# Patient Record
Sex: Male | Born: 1970 | Hispanic: Yes | Marital: Single | Smoking: Former smoker
Health system: Southern US, Community
[De-identification: ages and names within clinical notes are randomized; demographics above are authoritative.]

## PROBLEM LIST (undated history)

## (undated) DIAGNOSIS — E079 Disorder of thyroid, unspecified: Secondary | ICD-10-CM

## (undated) DIAGNOSIS — M549 Dorsalgia, unspecified: Secondary | ICD-10-CM

## (undated) DIAGNOSIS — I10 Essential (primary) hypertension: Secondary | ICD-10-CM

## (undated) DIAGNOSIS — G839 Paralytic syndrome, unspecified: Secondary | ICD-10-CM

## (undated) DIAGNOSIS — R220 Localized swelling, mass and lump, head: Secondary | ICD-10-CM

## (undated) DIAGNOSIS — G510 Bell's palsy: Secondary | ICD-10-CM

## (undated) DIAGNOSIS — K148 Other diseases of tongue: Secondary | ICD-10-CM

## (undated) HISTORY — PX: HAND SURGERY: SHX662

## (undated) HISTORY — PX: WRIST SURGERY: SHX841

---

## 2013-06-16 ENCOUNTER — Emergency Department
Admission: EM | Admit: 2013-06-16 | Discharge: 2013-06-16 | Disposition: A | Discharge status: Home / Self Care | Payer: Self-pay | Source: Home / Self Care | Attending: Emergency Medicine | Admitting: Emergency Medicine

## 2013-06-16 ENCOUNTER — Encounter: Payer: Self-pay | Admitting: Emergency Medicine

## 2013-06-16 DIAGNOSIS — K148 Other diseases of tongue: Secondary | ICD-10-CM

## 2013-06-16 HISTORY — DX: Paralytic syndrome, unspecified: G83.9

## 2013-06-16 NOTE — ED Provider Notes (Signed)
CSN: [id number]     Arrival date & time 06/16/13  0932 History   First MD Initiated Contact with Patient 06/16/13 [phone]     Chief Complaint  Patient presents with  . Mouth Lesions    HPI Patient c/o knot on right side of tongue x 10 years but recently has been having pain when eating and talking. He feels has gradually enlarged over the past 10 years. No bleeding or drainage. Denies fever chills, neck pain or swollen lymph nodes . No cardiorespiratory symptoms. No cough No other ENT symptoms. No nausea or vomiting or GI symptoms. Denies weight loss.  Past medical history: [place] 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 He has some chronic stable weakness left upper and lower extremity, but he is able to walk and function. He works in [employer].   Past Medical History  Diagnosis Date  . Paralysis    Past Surgical History  Procedure Laterality Date  . Hand surgery     Family History  Problem Relation Age of Onset  . Diabetes Mother    History  Substance Use Topics  . Smoking status: Former Games developermoker  . Smokeless tobacco: Never Used  . Alcohol Use: No    Review of Systems  All other systems reviewed and are negative.   Allergies  Review of patient's allergies indicates no known allergies.  Home Medications   Prior to Admission medications   Not on File   BP 127/80  Pulse 71  Temp(Src) 98.4 F (36.9 C) (Oral)  Resp 16  Ht 5\' 5"  (1.651 m)  Wt 195 lb 12 oz (88.792 kg)  BMI 32.57 kg/m2  SpO2 99% Physical Exam  Nursing note and vitals reviewed. Constitutional: He is oriented to person, place, and time. He appears well-developed and well-nourished. No distress.  HENT:  Head: Normocephalic and atraumatic.  Right Ear: Tympanic membrane and external ear normal.  Left Ear: Tympanic membrane and external ear normal.  Nose: Nose normal.  Mouth/Throat: No oropharyngeal exudate, posterior  oropharyngeal edema, posterior oropharyngeal erythema or tonsillar abscesses.    Right lateral posterior tongue, there is a fleshy pedunculated 1 x 1 cm raised lesion. It is mobile. No fluctuance or tenderness or bleeding or drainage . Oropharynx otherwise within normal limits without any other lesions  Left external ear: 3 x 3 mm stent dilated fleshy raised lesion that's nontender, without any bleeding or fluctuance or tenderness.  Eyes: Conjunctivae and EOM are normal. Pupils are equal, round, and reactive to light. No scleral icterus.  Neck: Normal range of motion. Neck supple.  No lymphadenopathy or mass  Cardiovascular: Normal rate and normal heart sounds.   Pulmonary/Chest: Effort normal and breath sounds normal.  Abdominal: He exhibits no distension.  Musculoskeletal: Normal range of motion.  Mild atrophy, 3+ strength left upper extremity and left lower extremity, but function otherwise normal .  4+ strength right upper and lower extremity  Neurological: He is alert and oriented to person, place, and time.  Skin: Skin is warm. No rash noted.  Psychiatric: He has a normal mood and affect.    ED Course  Procedures (including critical care time) Labs Review Labs Reviewed - No data to display  Imaging Review No results found.   MDM   1. Lesion of tongue    Most likely is benign, but explained to him that I cannot tell for certain and that he needs to see  an ENT for definitive care and surgically remove this.--I explained risks of has not seen an ENT  He has no PCP, and I urged him to establish with a PCP or clinic for preventive care, I explained risks of not doing so.  He speaks fair [place]. Also, ournurse Rhunette Croft(Mildred) assisted with Spanish interpretation so we could be certain that he understood instructions. He voiced understanding and agreement. (both in [place] and [place])   The following written instructions given from the AVS:  "You have a growth on the right side  of your tongue.--It is NOT contagious. By its appearance, it most likely is NOT cancer, however you need to see an ear nose throat specialist to have this surgically removed. We will help assist making referral to ear nose throat specialist.  Su crecimiento en el lado derecho de su lengua--NO es contaguioso. Por la aparencia, es seguro que no es cancer, pero es importante que vea a el especialista de ojido, naris y Administratorgaraganta para que le remuevan con [place].  Nosotros vamos a ayudarlo con esta cita y contactarlo."  Lajean Manesavid Massey, MD 06/16/13 1025

## 2013-06-16 NOTE — Discharge Instructions (Signed)
You have a growth on the right side of your tongue.--It is NOT contagious. By its appearance, it most likely is NOT cancer, however you need to see an ear nose throat specialist to have this surgically removed. We will help assist making referral to ear nose throat specialist.  Su crecimiento en el lado derecho de su lengua--NO es contaguioso. Por la aparencia, es seguro que no es cancer, pero es importante que vea a el especialista de ojido, naris y Administratorgaraganta para que le remuevan con [place].  Nosotros vamos a ayudarlo con esta cita y contactarlo.

## 2013-06-16 NOTE — ED Notes (Signed)
Patient c/o knot on right side of tongue x 10 years but recently has been having pain when eating and talking.

## 2013-06-18 ENCOUNTER — Telehealth: Payer: Self-pay | Admitting: *Deleted

## 2014-05-18 ENCOUNTER — Encounter: Payer: Self-pay | Admitting: Emergency Medicine

## 2014-05-18 ENCOUNTER — Emergency Department
Admission: EM | Admit: 2014-05-18 | Discharge: 2014-05-18 | Disposition: A | Discharge status: Home / Self Care | Payer: Self-pay | Source: Home / Self Care | Attending: Emergency Medicine | Admitting: Emergency Medicine

## 2014-05-18 DIAGNOSIS — B86 Scabies: Secondary | ICD-10-CM

## 2014-05-18 MED ORDER — HYDROXYZINE HCL 25 MG PO TABS: 25.0000 mg | ORAL_TABLET | Freq: Once | ORAL | Status: DC

## 2014-05-18 MED ORDER — PERMETHRIN 5 % EX CREA: TOPICAL_CREAM | CUTANEOUS | Status: DC

## 2014-05-18 NOTE — Discharge Instructions (Signed)
Escabiosis (Scabies) La escabiosis son pequeos parsitos (caros) que horadan la piel y causan protuberancias rojas y picazn. Estos parsitos slo pueden verse en el microscopio. Son muy contagiosos. Se diseminan fcilmente de una persona a otra por contacto directo. Tambin el contagio se produce al compartir prendas de vestir o ropa de cama. No es infrecuente que una familia entera se infecte al compartir toallas, prendas de vestir o ropa de cama.  INSTRUCCIONES PARA EL CUIDADO DOMICILIARIO  El profesional que lo asiste podr prescribirle alguna crema o locin para eliminar los caros. Si se le prescribe, masajee la crema en cada centmetro cuadrado de piel, desde el cuello hasta las plantas de los pies. Tambin aplique la crema en el cuero cabelludo y rostro si se trata de un nio de menos de 1 ao. Evite aplicarla en los ojos y en la boca. No se lave las manos despus de la aplicacin.  Djela durante 8 a 12 horas. El nio podr baarse o darse una ducha despus de 8 a 12 horas de la aplicacin. A veces es til aplicar la crema justo antes de la hora de dormir.  Generalmente un tratamiento es suficiente y eliminar aproximadamente el 95% de las infecciones. El los casos graves se indicar repetir el tratamiento luego de 1 semana. Todas las personas que habitan en la misma casa deben tratarse con una aplicacin de la crema.  No debern aparecer nuevas erupciones ni galeras luego de las 24 a 48 horas del tratamiento; sin embargo la picazn podra durar de 2 a 4 semanas despus del tratamiento. ste podr tambin prescribirle un medicamento para ayudarle con la picazn o hacer que desaparezca ms rpidamente.  Estos parsitos pueden vivir en la ropa hasta 3 das. Lave con agua caliente y seque a temperatura elevada durante 20 minutos todas las prendas, toallas, peluches y ropa de cama que el nio haya usado recientemente. Las prendas que no pueden lavarse, debern ser colocadas en una bolsa plstica  durante al menos 3 das.  Para aliviar la picazn, dele al nio en un bao de agua fra o aplique paos fros en las zonas afectadas.  El nio podr regresar a la escuela despus del tratamiento con la crema prescripta. SOLICITE ANTENCIN MDICA SI:  La picazn persiste durante ms de 4 semanas despus del tratamiento.  La erupcin se disemina o se infecta. Los signos de infeccin son ampollas rojas o costras de color marrn amarillento. Document Released: 11/11/2004 Document Revised: 04/26/2011 ExitCare Patient Information 2015 [employer]. This information is not intended to replace advice given to you by your health care provider. Make sure you discuss any questions you have with your health care provider.  

## 2014-05-18 NOTE — ED Notes (Signed)
Pt states his friend was dx with scabies yesterday and he has been in his home recently. He noticed some itching and rash on his ankels yesterday.

## 2014-05-18 NOTE — ED Provider Notes (Signed)
CSN: [id number]     Arrival date & time 05/18/14  1507 History   First MD Initiated Contact with Patient 05/18/14 1512     Chief Complaint  Patient presents with  . Rash   (Consider location/radiation/quality/duration/timing/severity/associated sxs/prior Treatment) HPI Pt states his friend was dx with scabies yesterday and he has been in his home recently. He noticed some itching and rash on his ankles and right groin yesterday. Otherwise feels well. No GU symptoms. Denies fever or chills or nausea or vomiting or cardiorespiratory symptoms. Denies any acute neurologic symptoms or swollen joints.  Remainder of Review of Systems negative for acute change except as noted in the HPI.  Past Medical History  Diagnosis Date  . Paralysis    Past Surgical History  Procedure Laterality Date  . Hand surgery     Family History  Problem Relation Age of Onset  . Diabetes Mother    History  Substance Use Topics  . Smoking status: Current Every Day Smoker    Types: Cigarettes  . Smokeless tobacco: Never Used  . Alcohol Use: No    Review of Systems  Allergies  Review of patient's allergies indicates no known allergies.  Home Medications   Prior to Admission medications   Medication Sig Start Date End Date Taking? Authorizing Provider  hydrOXYzine (ATARAX/VISTARIL) 25 MG tablet Take 1 tablet (25 mg total) by mouth once. Take 1 at bedtime as needed for itch. May cause drowsiness. 05/18/14   Lajean Manes, MD  permethrin (ELIMITE) 5 % cream Apply everywhere from the neck down, then wash off 8 hrs later. If needed, may repeat in 5 days if symptoms persist 05/18/14   Lajean Manes, MD   BP 124/75 mmHg  Pulse 76  Temp(Src) 97.9 F (36.6 C) (Oral)  SpO2 96% Physical Exam  Constitutional: He is oriented to person, place, and time. He appears well-developed and well-nourished. No distress.  HENT:  Head: Normocephalic and atraumatic.  Eyes: Conjunctivae and EOM are normal. Pupils are equal,  round, and reactive to light. No scleral icterus.  Neck: Normal range of motion.  Cardiovascular: Normal rate.   Pulmonary/Chest: Effort normal.  Abdominal: He exhibits no distension.  Musculoskeletal: Normal range of motion.  Neurological: He is alert and oriented to person, place, and time.  Skin: Skin is warm.  Psychiatric: He has a normal mood and affect.  Nursing note and vitals reviewed.  Skin: Tiny discrete red papules on both ankles and right proximal medial thigh, consistent with scabies. No pustules or vesicles. ED Course  Procedures (including critical care time) Labs Review Labs Reviewed - No data to display  Imaging Review No results found.   MDM   1. Scabies    Treatment options discussed, as well as risks, benefits, alternatives. Patient voiced understanding and agreement with the following plans: Discharge Medication List as of 05/18/2014  4:07 PM    START taking these medications   Details  hydrOXYzine (ATARAX/VISTARIL) 25 MG tablet Take 1 tablet (25 mg total) by mouth once. Take 1 at bedtime as needed for itch. May cause drowsiness., Starting 05/18/2014, Print    permethrin (ELIMITE) 5 % cream Apply everywhere from the neck down, then wash off 8 hrs later. If needed, may repeat in 5 days if symptoms persist, Print       other printed information given, both in [place] and Spanish printout Follow-up with your primary care doctor or dermatologist in 7 days if not improving, or sooner if symptoms become worse. Precautions  discussed. Red flags discussed. Questions invited and answered. Patient voiced understanding and agreement.      Lajean Manesavid Massey, MD 05/18/14 [phone]

## 2014-05-20 ENCOUNTER — Encounter: Payer: Self-pay | Admitting: Emergency Medicine

## 2014-08-09 DIAGNOSIS — S161XXA Strain of muscle, fascia and tendon at neck level, initial encounter: Secondary | ICD-10-CM | POA: Insufficient documentation

## 2014-08-09 DIAGNOSIS — M47812 Spondylosis without myelopathy or radiculopathy, cervical region: Secondary | ICD-10-CM | POA: Insufficient documentation

## 2014-10-11 ENCOUNTER — Emergency Department (INDEPENDENT_AMBULATORY_CARE_PROVIDER_SITE_OTHER)
Admission: EM | Admit: 2014-10-11 | Discharge: 2014-10-11 | Disposition: A | Discharge status: Home / Self Care | Payer: 59 | Source: Home / Self Care | Attending: Emergency Medicine | Admitting: Emergency Medicine

## 2014-10-11 ENCOUNTER — Encounter: Payer: Self-pay | Admitting: *Deleted

## 2014-10-11 DIAGNOSIS — M79604 Pain in right leg: Secondary | ICD-10-CM

## 2014-10-11 DIAGNOSIS — M25511 Pain in right shoulder: Secondary | ICD-10-CM | POA: Diagnosis not present

## 2014-10-11 MED ORDER — MELOXICAM 15 MG PO TABS: 15.0000 mg | ORAL_TABLET | Freq: Every day | ORAL | Status: DC
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
## 2014-10-11 NOTE — ED Notes (Signed)
appt sch'ed with Dr.Corey for 10/14/14 @ 10AM per [place]. Clemens Catholic, LPN
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
## 2014-10-14 ENCOUNTER — Ambulatory Visit (INDEPENDENT_AMBULATORY_CARE_PROVIDER_SITE_OTHER): Payer: 59 | Admitting: Family Medicine

## 2014-10-14 ENCOUNTER — Encounter: Payer: Self-pay | Admitting: Family Medicine

## 2014-10-14 VITALS — BP 134/83 | HR 76 | Wt 202.0 lb

## 2014-10-14 DIAGNOSIS — M5441 Lumbago with sciatica, right side: Secondary | ICD-10-CM

## 2014-10-14 DIAGNOSIS — M546 Pain in thoracic spine: Secondary | ICD-10-CM | POA: Diagnosis not present

## 2014-10-14 DIAGNOSIS — M545 Low back pain, unspecified: Secondary | ICD-10-CM | POA: Insufficient documentation

## 2014-10-14 MED ORDER — CYCLOBENZAPRINE HCL 10 MG PO TABS: 10.0000 mg | ORAL_TABLET | Freq: Three times a day (TID) | ORAL | Status: DC | PRN
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
## 2014-10-14 NOTE — Assessment & Plan Note (Signed)
Patient has hyperalgesia in the thoracic area.  Unclear etiology. X-rays were normal. Work on physical therapy. Return if not better. Treat with Flexeril.

## 2014-10-14 NOTE — Patient Instructions (Signed)
Thank you for coming in today. Attend physical therapy.  Return in 2-4 weeks.  Come back or go to the emergency room if you notice new weakness new numbness problems walking or bowel or bladder problems. We will get records sent over from [facility].   Citica con rehabilitacin (Sciatica with Rehab) El nervio citico va desde la regin inferior de la espalda hacia la pierna y es el responsable de la sensibilidad y el control de los msculos de la parte de atrs (posterior) del muslo, pierna y pie. La citica es una enfermedad caracterizada por una inflamacin en este nervio.  SNTOMAS  Signos de dao al nervio, incluso adormecimiento o debilidad en el lado posterior de las extremidades bajas.  Dolor en la parte posterior del muslo que podra bajar hacia la pierna.  Dolor que [employer] al estar sentado durante largos perodos de [place].  Algunas veces, sensibilidad en las nalgas. CAUSAS La causa de la citica es la inflamacin de los nervios citicos. La inflamacin se debe a que algo irrita el nervio. Entre las causas de la irritacin se encuentran:  Estar sentado durante largos perodos.  Traumatismos directos al nervio.  Artritis de Landscape architect.  Hernia o ruptura de disco.  Deslizamiento de Blenda Bridegroom (espondilolistesis).  Presin de los tejidos blandos, como msculos o el tejidos tipo ligamento (fascia). LOS RIESGOS AUMENTAN CON:  Deportes en los que se presiona la columna (ftbol americano o levantamiento de pesas).  Poca fuerza y flexibilidad.  No hacer un precalentamiento adecuado.  Historia familiar de dolor de cintura o trastornos en discos.  Lesiones o cirugas previas en la espalda.  Mecnica incorrecta del cuerpo, en especial al levantar, o mala postura. PREVENCIN  Precalentamiento adecuado y elongacin antes de la [place].  Mantener la forma fsica:  Earma [place], flexibilidad y resistencia muscular.  Capacidad cardiovascular.  Conozca y use tcnicas  adecuadas, especialmente en posturas y levantamiento. Cuando sea posible, tener un entrenador que corrija la Administrator.  Evite las actividades que tensionen constantemente la columna. PRONSTICO Si se trata adecuadamente, generalmente es curable dentro de las 6 semanas. En ocasiones requiere someterse a [place].  COMPLICACIONES RELACIONADAS  Dao permanente que incluye dolor, adormecimiento, hormigueo o debilidad.  Dolor crnico en la espalda.  Riesgos de la ciruga: infecciones, hemorragias, dao en los nervios, o daos a los tejidos circundantes. TRATAMIENTO El tratamiento inicial incluye interrumpir las actividades que agravan los sntomas. Se incluye el uso de medicamentos y la aplicacin de hielo para reducir Chief Technology Officer y la inflamacin. Los ejercicios de elongacin y fortalecimiento pueden ayudar a reducir Chief Technology Officer con la [place]. Los ejercicios pueden Management consultant o con un terapeuta. Un terapeuta podr recomendarle otros tratamientos, como estimulacin nerviosa electrnica transcutnea (TENS) o ultrasonido. En algunos casos se indica una inyeccin de corticoides para reducir la inflamacin del nervio citico. Si los sntomas persisten por ms de 6 meses de tratamiento no quirrgico (conservador), se Public relations account executive. MEDICAMENTOS   Si necesita analgsicos, se recomiendan los antiinflamatorios no esteroides, como aspirina e ibuprofeno y otros calmantes menores, como acetaminofeno.  No tome medicamentos para el dolor dentro de los [address] previos a la [place].  Los analgsicos prescriptos se indicarn si el mdico lo considera necesario. Utilcelos como se le indique y slo cuando lo necesite.  Los ungentos pueden ser beneficiosos.  En algunos casos se indica una inyeccin de corticosteroides. Estas inyecciones deben reservarse para los casos graves, porque slo se pueden administrar una determinada cantidad de  veces. CALOR Y FRO   El tratamiento con fro  [employer] y reduce la inflamacin. El fro debe aplicarse durante 10 a 15 minutos cada 2  3 horas para reducir la inflamacin y Chief Technology Officer e inmediatamente despus de cualquier actividad que agrava los sntomas. Utilice bolsas de hielo o masajee la zona con un trozo de hielo (masaje de hielo).  El calor puede usarse antes de Therapist, music y de las actividades de fortalecimiento indicadas por el profesional, le fisioterapeuta o Orthoptist. Utilice una bolsa trmica o sumerja la lesin en agua caliente. SOLICITE ATENCIN MDICA SI:  El tratamiento parece no ofrecer beneficios, o el trastorno [employer].  Los medicamentos producen efectos secundarios. EJERCICIOS EJERCICIOS DE AMPLITUD DE MOVIMIENTOS Y ELONGACIN Citica La mayora de las personas con dolor de citica encuentran que sus sntomas empeoran con el delantero excesiva flexin (flexin) o arco en la espalda baja (extensin). Los ejercicios que le ayudarn a Oncologist sus sntomas se Research scientist (life sciences). El mdico, fisioterapeuta o Furniture conservator/restorer a Chief Strategy Officer qu ejercicios sern de ayuda para resolver su dolor de espalda. No realice ningn ejercicio sin consultarlo antes con el profesional. Discontinue los ejercicios que empeoran sus sntomas, hasta que hable con el mdico. Si siente dolor, entumecimiento u hormigueo que [employer] glteos, piernas o pies, el objetivo de esta terapia es que estos sntomas se acerquen a la espalda y Customer service manager. En ocasiones, los sntomas de la pierna mejorarn, Development worker, international aid en la espalda puede empeorar; esto es un indicio tpico del progreso en la rehabilitacin. Asegrese de que estar atento a cualquier cambio en sus sntomas y las actividades que ha [employer]	[place] 24 horas antes del cambio. Compartir esta informacin con su mdico le permitir un mejor tratamiento para tratar su enfermedad. Estos ejercicios le ayudarn en la recuperacin de la lesin. Los sntomas podrn  aliviarse con o sin asistencia adicional de su mdico, fisioterapeuta o Herbalist. Al completar estos ejercicios, recuerde:   Restaurar la flexibilidad del tejido ayuda a que las articulaciones recuperen el movimiento normal. Esto permite que el movimiento y la actividad sea ms saludables y menos dolorosos.  Para que sea efectiva, cada elongacin debe realizarse durante al menos 30 segundos.  La elongacin nunca debe ser dolorosa. Deber sentir slo un alargamiento o distensin suave del tejido que estira. EJERCICIOS DE AMPLITUD DE MOVIMIENTOS Y ELONGACIN: ELONGACION Flexin - una rodilla al pecho  Recustese en una cama dura o sobre el piso, con ambas piernas extendidas al frente.  Manteniendo una pierna en contacto con el piso, lleve la rodilla opuesta al pecho. Mantenga la pierna en esa posicin, sostenindola por la zona posterior del muslo o por la rodilla.  Presione hasta sentir un suave estiramiento en la cintura. Mantenga esta posicin durante __________ segundos.  Libere la pierna lentamente y repita el ejercicio con el lado opuesto. Reptalo __________ veces. Realice este estiramiento __________ Anthoney Harada por da.  ELONGACIN Flexin, dos rodillas al pecho   Recustese en una cama dura o sobre el piso, con ambas piernas extendidas al frente.  Manteniendo una pierna en contacto con el piso, lleve la rodilla opuesta al pecho.  Tense los msculos del estmago para apoyar la espalda y levante la otra rodilla [place]. Mantenga las piernas en su lugar y tmese por detrs de las caderas o las rodillas.  Con ambas rodillas en el pecho, tire hasta que sienta un estiramiento en la parte trasera de la espalda. Mantenga esta  posicin durante __________ segundos.  Tense los msculos del estmago y baje las piernas de a una por vez. Reptalo __________ veces. Realice este estiramiento __________ Anthoney Harada por da.  ELONGACIN Rotacin de la zona baja del tronco.  Recustese sobre una  cama firme o sobre el suelo. Mantenga las piernas al frente, doble las rodillas de modo que ambas apunten hacia el techo y los pies queden bien apoyados en el piso.  Extienda los brazos a Dance movement psychotherapist. Esto estabilizar la zona superior del cuerpo, manteniendo los hombros en contacto con el piso.  Con cuidado y lentamente deje caer ambas rodillas juntas hacia un lado, hasta que sienta un suave estiramiento en la espalda baja. Mantenga esta posicin durante __________ segundos.  Tense los msculos del estmago para apoyar la espalda y lleve las rodillas a la posicin inicial. Repita el ejercicio hacia el otro lado. Reptalo __________ veces. Realice este ejercicio __________ veces por da. EJERCICIOS DE AMPLITUD DE MOVIMIENTOS Y FLEXIBILIDAD: ELONGACIN Extensin posicin prona sobre los codos  Acustese sobre el estmago sobre el piso, una cama ser muy blanda. Coloque las palmas a una distancia igual al ancho de los hombres y a la altura de la cabeza.  Coloque los codos bajo los hombros. Si siente dolor, colquese almohadas debajo del pecho.  Deje que su cuerpo se relaje, de modo que las caderas queden ms abajo y tengan ms contacto con el piso.  Mantenga esta posicin durante __________ segundos.  Vuelva lentamente a la posicin plana sobre el piso. Reptalo __________ veces. Realice este estiramiento __________ Anthoney Harada por da.  AMPLITUD DE MOVIMIENTOS - Extensin - flexin de brazos en posicin prona  Acustese sobre el [employer]	[place] piso, una cama ser [place]. Coloque las palmas a una distancia igual al ancho de los hombres y a la altura de la cabeza.  Mantenga la espalda tan relajada como pueda, enderece lentamente los codos [address] las caderas contra el suelo. Puede modificar la posicin de las manos para estar ms cmodo. A medida que gana movimiento, sus manos quedarn ms por debajo de los hombros.  Mantenga cada posicin durante __________ segundos.  Vuelva  lentamente a la posicin plana sobre el piso. Reptalo __________ veces. Realice este estiramiento __________ Anthoney Harada por da.  EJERCICIOS DE FORTALECIMIENTO - Citica Estos ejercicios le ayudarn en la recuperacin de la lesin. Estos ejercicios deben hacerse cerca de su "punto dulce". Este es el arco neutro, de la parte baja de la espalda, en algn lugar entre la posicin completamente redondeada y arqueada plenamente, que es la posicin menos dolorosa. Cuando se realiza en [employer] de seguridad del movimiento, estos ejercicios se pueden Chemical engineer para las personas que tienen una lesin basada en flexin o extensin. Con estos ejercicios, los sntomas podrn desaparecer con o sin mayor intervencin del profesional, el fisioterapeuta o Orthoptist. Al completar estos ejercicios, recuerde:   Los msculos pueden ganar tanto la resistencia como la fuerza que necesita para sus actividades diarias a travs de ejercicios controlados.  Realice los ejercicios como se lo indic el mdico, el fisioterapeuta o Orthoptist. [employer] slo con los ejercicios de resistencia y haga las repeticiones que su mdico le indique.  Podr experimentar dolor o cansancio muscular, pero el dolor o molestia que trata de eliminar a travs de los ejercicios nunca debe empeorar. Si el dolor empeora, detngase y asegrese de que est siguiendo las directivas correctamente. Si an siente dolor luego de Education officer, environmental lo ajustes necesarios, deber discontinuar el ejercicio hasta que  pueda conversar con [employer]. FORTALECIMIENTO - Abdominales profundos - Inclinacin plvica  Recustese sobre una cama firme o sobre el suelo. Mantenga las piernas al frente, doble las rodillas de modo que ambas apunten hacia el techo y los pies queden bien apoyados en el piso.  Tensione la zona baja de los msculos abdominales para presionar la Oncologist. Este movimiento har rotar su pelvis de modo que el cccix quede  hacia arriba y no apuntando a los pies o hacia el piso.  Con una tensin suave y respiracin pareja, mantenga esta posicin durante __________ segundos. Reptalo __________ veces. Realice este estiramiento __________ Anthoney Harada por da.  FORTALECIMIENTO - Abdominales encogimiento abdominal  Recustese sobre una cama firme o sobre el suelo. Mantenga las piernas al frente, doble las rodillas de modo que ambas apunten hacia el techo y los pies queden bien apoyados en el piso. Cruce las [employer].  Apunte suavemente con la barbilla hacia abajo, sin doblar el cuello.  Tensione los abdominales y eleve lentamente el tronco la altura suficiente para despegar los omplatos. Si se eleva ms, pondr tensin excesiva en la cintura y esto no fortalecer ms los abdominales.  Controle la vuelta a la posicin inicial. Reptalo __________ veces. Realice este estiramiento __________ Anthoney Harada por da.  FORTALECIMIENTO - En cuadrpedo, elevacin de miembro superior e inferior opuestos  [employer] y las rodillas en una superficie firme. Las manos deben quedar a la altura de los hombros y las rodillas debajo de las caderas. Puede colocar algo debajo las rodillas para estar ms cmodo.  Encuentre la posicin neutral de la columna vertebral y Public house manager los msculos abdominales de modo que pueda mantener esta posicin. Los hombros y las caderas deben formar un rectngulo paralelo con el suelo y recto.  Manteniendo el tronco firme, eleve la mano derecha a la altura del hombro y luego eleve la pierna izquierda a la altura de la cadera. Asegrese de no contener la respiracin. Mantenga cada posicin durante __________ segundos.  Con los msculos abdominales en tensin y la espalda firme, vuelva lentamente a la posicin inicial. Repita con el otro brazo y la otra pierna. Reptalo __________ veces. Realice este estiramiento __________ Anthoney Harada por da.  FUERZA Abdominales y cudriceps - Lexicographer las  piernas rectas  Recustese en una cama dura o sobre el piso, con ambas piernas extendidas al frente.  Deje una pierna en contacto con el suelo y doble la otra rodilla de manera que el pie quede contra el suelo.  Encuentre la posicin neutral de la columna vertebral y Public house manager los msculos abdominales de modo que pueda mantener esta posicin.  Levante lentamente la pierna del suelo una 6 pulgadas y cuente [place] 15, asegrese de no contener la respiracin.  Mantega la columna en posicin neutral, y baje lentamente la pierna hasta el suelo. Repita el ejercicio con cada pierna __________ veces. Realice este estiramiento __________ Anthoney Harada por da. CONSIDERACIONES ACERCA DE LA POSTURA Y LA MECNICA DEL CUERPO Citica Si mantiene una postura correcta cuando se encuentre de pie, sentado o realizando sus actividades, reducir el [employer] tejidos del cuerpo, y Acupuncturist a los tejidos lesionados la posibilidad de curarse y Film/video editor las experiencias dolorosas. A continuacin se indican pautas generales para mejorar la postura- Su mdico o fisioterapeuta le dar instrucciones especficas segn sus necesidades. Al leer estas pautas recuerde:  Los ejercicios indicados por su mdico lo ayudarn a Chartered certified accountant flexibilidad y Primary school teacher  para Therapist, nutritional.  Una postura correcta le proporciona a sus articulaciones el medio ptimo para funcionar bien. Las articulaciones se desgastan menos cuando estn sostenidas adecuadamente por una columna vertebral en buena postura. Esto significa que su cuerpo estar ms sano y Research officer, trade union.  La correcta postura debe practicarse en todas las actividades, especialmente al estar sentado o de pie durante [place]. Tambin es importante al realizar actividades repetitivas de bajo estrs (tipeo) o una actividad nica y pesada. POSICIONES DE Dellie Catholic Tenga en cuenta cules son las posturas que ms dolor le causan al elegir una posicin de  descanso. Si siente dolor con las actividades en que deba realizar una flexin (sentarse, inclinarse, detenerse, ponerse en cuclillas), elija una posicin que le permita descansar en una postura menos flexionada. Evite curvarse en posicin fetal cuando se encuentre de lado. Si el dolor empeora con las actividades basadas en la extensin (estar de pie durante un tiempo prolongado, trabajar con las manos por arriba de la cabeza) evite descansar en Neomia Dear posicin extendida durante mucho tiempo, como dormir sobre el [place]. La [employer] de las Psychologist, forensic cmodo el descanso sobre la columna vertebral en una posicin neutral, ni muy redondeada ni [place]. Recustese sobre su lado en una cama que no est hundida con una almohada entre las rodillas o sobre la espalda con una almohada bajo las rodillas, y sentir [place]. Tenga en cuenta que cualquier posicin en [employer], no importa si es una postura [place], puede provocarle rigidez. POSTURAS CORRECTAS PARA SENTARSE Con el fin de minimizar el estrs y Environmental health practitioner en su columna, deber sentarse con la postura correcta. Esto le ayudar a que el cuerpo est ms sano. Recuperar una buena postura es un proceso gradual. Muchas personas pueden trabajar ms cmodas mediante el uso de diferentes soportes hasta que tengan la flexibilidad y la fuerza para mantener esta postura por su cuenta. Al sentarse con la [employer], los odos deben estar sobre los hombros y los hombros sobre las caderas. Debe utilizar el respaldo de la silla para apoyar la espalda. La espalda estar en una posicin neutral, ligeramente arqueada. Puede colocar una pequea almohada o toalla doblada en la base de la espalda baja para apoyarse.  Si trabaja en un escritorio, cree un ambiente que le proporciones un buen soporte y [place]. Sin soporte extra, los msculos se fatigan y causan tensin excesiva en las articulaciones y otros tejidos. Tenga en cuenta estas  recomendaciones: SILLA:   La silla debe poder deslizarse por debajo del escritorio cuando su espalda tome contacto con el respaldo. Esto le permitir trabajar ms cerca.  La altura de la silla debe permitirle que los ojos tengan el nivel de la parte superior del monitor y las manos estn ms abajo que los codos. POSICIN DEL CUERPO  Los pies deben tener contacto con el piso. Si no es posible, use un posapies.  Mantenga las [employer] hombros. Esto reducir el estrs en el cuello y en la cintura. POSTURAS INCORRECTAS PARA SENTARSE  Si se siente cansado e incapaz de asumir una postura sentada sana, no se encorve ni se hunda. Esto pone una tensin excesiva en los tejidos de su espalda, y causa ms dao y Engineer, mining.Entre las opciones ms saludables se incluyen:  El uso de ms apoyo, como una almohada lumbar.  Cambio de tareas, a algo que demande una posicin vertical o caminar.  Tomar una breve caminata.  Recostarse y Lawyer en una posicin neutral. DE  PIE DURANTE UN TIEMPO PROLONGADO E INCLINADO LIGERAMENTE HACIA ADELANTE Cuando deba realizar una tarea que requiera inclinacin hacia adelante estando de pie en el mismo sitio durante mucho tiempo, coloque un pie en un objeto de 2 a 4 pulgadas de alto, para [employer]. Cuando ambos pies estn en el piso, la zona inferior de la espalda tiene a perder su ligera curvatura hacia adentro. Si esta curva se aplana (o se pronuncia demasiado) la espalda y las articulaciones experimentarn demasiado estrs, se fatigarn ms rpidamente y Geophysicist/field seismologist.  POSTURAS CORRECTAS PARA ESTAR DE PIE Una postura adecuada de pie realizarse en todas las actividades diarias, incluso si slo toman un momento, como al [employer]. Como en la postura de sentado, los odos deben estar sobre los hombros y los hombros sobre las caderas. Deber mantener una ligera tensin en sus msculos abdominales para asegurar la columna vertebral. El  cccix debe apuntar hacia el suelo, no detrs de su cuerpo, por que resulta en una curvatura de la espalda sobre-extendida.  POSTURAS INCORRECTAS PARA ESTAR DE PIE Las posturas incorrectas para estar de pie incluyen tener la cabeza hacia delante, las rodillas bloqueadas o una excesiva curvatura de la espalda. CAMINAR [place] en Deborha Payment erguida. Las [place], hombros y caderas deben estar alineados. ACTIVIDAD PROLONGADA EN UNA POSICIN FLEXIONADA Al completar una tarea que requiere que se doble la cintura hacia adelante o inclinarse sobre una superficie baja, trate de encontrar una manera de estabilizar 3 de cada 4 de sus miembros. Puede colocar una mano o el codo en el [employer], o descansar una rodilla en la superficie en la que est apoyado. Esto le proporcionar ms estabilidad para que sus msculos no se cansen tan rpidamente. El [employer] rodillas [place], o ligeramente dobladas, tambin reducir el estrs en la espalda baja. TCNICAS CORRECTAS PARA LEVANTAR OBJETOS SI:   Asumir una postura amplia. Esto le proporcionar ms estabilidad y la oportunidad de acercarse lo ms posible al objeto que se est levantando.  Tense los abdominales para asegurar la columna vertebral; luego flexione rodillas y caderas. Manteniendo la espalda en una posicin neutral, haga el esfuerzo con los msculos de la pierna. Levntese con las piernas, manteniendo la espalda derecha.  Pruebe el peso de los objetos desconocidos antes de tratar de Secondary school teacher.  Trate de [employer] codos hacia abajo y a los lados, con el fin de obtener la fuerza de los hombros al llevar un objeto.  Siempre pida ayuda a otra persona cuando deba levantar objetos pesados o incmodos TCNICAS INCORRECTAS PARA LEVANTAR OBJETOS NO:   Bloquee rodillas al levantar, aunque sea un objeto pequeo.  Se doble ni gire. Gire sobre los pies ni los mueva cuando necesite cambiar de direccin.  Tome conciencia que no puede levantar con  seguridad ni un clip de papel, sin Clinical biochemist. Document Released: 01/20/2009 Document Revised: 04/26/2011 [facility] Patient Information 2015 Rule, [place]. This information is not intended to replace advice given to you by your health care provider. Make sure you discuss any questions you have with your health care provider.

## 2014-10-14 NOTE — Assessment & Plan Note (Signed)
Patient's pain today is largely related to his low back pain with right leg radicular component. This is different from the pain he was evaluated for [place] with his neck. X-rays were normal therefore I do not feel that we need to repeat them today. We'll have a trial of physical therapy dedicated to his lumbar pain. If no better in a few weeks will obtain an MRI of the lumbar spine.

## 2014-10-18 ENCOUNTER — Ambulatory Visit: Payer: Self-pay | Admitting: Physical Therapy

## 2014-10-23 ENCOUNTER — Encounter: Payer: Self-pay | Admitting: Rehabilitative and Restorative Service Providers"

## 2014-10-23 ENCOUNTER — Ambulatory Visit (INDEPENDENT_AMBULATORY_CARE_PROVIDER_SITE_OTHER): Payer: Self-pay | Admitting: Rehabilitative and Restorative Service Providers"

## 2014-10-23 DIAGNOSIS — M2560 Stiffness of unspecified joint, not elsewhere classified: Secondary | ICD-10-CM

## 2014-10-23 DIAGNOSIS — M546 Pain in thoracic spine: Secondary | ICD-10-CM

## 2014-10-23 DIAGNOSIS — M545 Low back pain, unspecified: Secondary | ICD-10-CM

## 2014-10-23 DIAGNOSIS — M623 Immobility syndrome (paraplegic): Secondary | ICD-10-CM

## 2014-10-23 DIAGNOSIS — Z7409 Other reduced mobility: Secondary | ICD-10-CM
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
## 2014-10-25 ENCOUNTER — Encounter: Payer: 59 | Admitting: Rehabilitative and Restorative Service Providers"

## 2014-11-04 ENCOUNTER — Ambulatory Visit (INDEPENDENT_AMBULATORY_CARE_PROVIDER_SITE_OTHER): Payer: Self-pay | Admitting: Family Medicine

## 2014-11-04 ENCOUNTER — Encounter: Payer: Self-pay | Admitting: Family Medicine

## 2014-11-04 VITALS — BP 131/74 | HR 84 | Wt 205.0 lb

## 2014-11-04 DIAGNOSIS — M546 Pain in thoracic spine: Secondary | ICD-10-CM

## 2014-11-04 NOTE — Assessment & Plan Note (Signed)
Trial of return to work next week. If not able to return to work. We'll consider a functional capacity evaluation.

## 2014-11-04 NOTE — Patient Instructions (Signed)
Thank you for coming in today. Return in 3-4 weeks.  Return to work

## 2014-11-04 NOTE — Progress Notes (Signed)
Marc Gonzalez is a 44 y.o. male who presents to [facility]: [facility]  today for follow-up back pain.  Patient was seen on August 29 for back pain. He's had one episode of physical therapy notes that it helped a lot. He rates his pain currently in his thoracic back in about a 5 out of 10. He notes his low back pain has significantly improved. When asked he thinks he is ready to try to go back to work next week. He notes mild pain when he exerts himself around the house. He denies any radiating pain weakness or numbness fevers or chills.   Past Medical History  Diagnosis Date  . Paralysis    Past Surgical History  Procedure Laterality Date  . Hand surgery     Social History  Substance Use Topics  . Smoking status: Current Every Day Smoker -- 0.50 packs/day    Types: Cigarettes  . Smokeless tobacco: Never Used  . Alcohol Use: No   family history includes Diabetes in his mother; Hypertension in his mother.  ROS as above Medications: Current Outpatient Prescriptions  Medication Sig Dispense Refill  . cyclobenzaprine (FLEXERIL) 10 MG tablet Take 1 tablet (10 mg total) by mouth 3 (three) times daily as needed for muscle spasms. 90 tablet 1  . meloxicam (MOBIC) 15 MG tablet Take 1 tablet (15 mg total) by mouth daily. As needed for pain. Take with food. 7 tablet 0   No current facility-administered medications for this visit.   No Known Allergies   Exam:  BP 131/74 mmHg  Pulse 84  Wt 205 lb (92.987 kg)  SpO2 97% Gen: Well NAD HEENT: EOMI,  MMM Lungs: Normal work of breathing. CTABL Heart: RRR no MRG Abd: NABS, Soft. Nondistended, Nontender Exts: Brisk capillary refill, warm and well perfused.  Back: Mildly tender palpation bilateral rhomboids. Normal back range of motion. Normal gait. Normal upper and lower extremity strength.  No results found for this or any previous visit (from the past 24 hour(s)). No results found.   Please see individual  assessment and plan sections.

## 2014-11-14 ENCOUNTER — Encounter: Payer: Self-pay | Admitting: Family Medicine

## 2014-11-14 ENCOUNTER — Ambulatory Visit (INDEPENDENT_AMBULATORY_CARE_PROVIDER_SITE_OTHER): Payer: Self-pay | Admitting: Family Medicine

## 2014-11-14 VITALS — BP 151/97 | HR 85 | Wt 202.0 lb

## 2014-11-14 DIAGNOSIS — I8393 Asymptomatic varicose veins of bilateral lower extremities: Secondary | ICD-10-CM

## 2014-11-14 DIAGNOSIS — M545 Low back pain, unspecified: Secondary | ICD-10-CM

## 2014-11-14 DIAGNOSIS — M546 Pain in thoracic spine: Secondary | ICD-10-CM

## 2014-11-14 DIAGNOSIS — I8390 Asymptomatic varicose veins of unspecified lower extremity: Secondary | ICD-10-CM
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
## 2014-11-14 NOTE — Assessment & Plan Note (Signed)
Etiology at this time is unclear. Patient has had symptoms for about 6 months and is unable to work. This time for definitive diagnosis. Obtain MRI of lumbar back. This will likely be largely normal and will refer for functional capacity evaluation. Return following MRI. Continue physical therapy.
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
## 2014-11-14 NOTE — Patient Instructions (Signed)
Thank you for coming in today. Go back to physical therapy.  Get the MRI next week.  Return a few days after the MRI to discuss results.

## 2014-11-14 NOTE — Assessment & Plan Note (Signed)
Etiology at this time is unclear. Patient has had symptoms for about 6 months and is unable to work. This time for definitive diagnosis. Obtain MRI of thoracic back. This will likely be largely normal and will refer for functional capacity evaluation. Return following MRI. Continue physical therapy.

## 2014-11-18 ENCOUNTER — Ambulatory Visit: Payer: Self-pay | Admitting: Rehabilitative and Restorative Service Providers"

## 2014-11-19 ENCOUNTER — Ambulatory Visit (INDEPENDENT_AMBULATORY_CARE_PROVIDER_SITE_OTHER): Payer: Self-pay | Admitting: Physical Therapy

## 2014-11-19 ENCOUNTER — Encounter: Payer: Self-pay | Admitting: Physical Therapy

## 2014-11-19 DIAGNOSIS — M545 Low back pain, unspecified: Secondary | ICD-10-CM

## 2014-11-19 DIAGNOSIS — M546 Pain in thoracic spine: Secondary | ICD-10-CM

## 2014-11-19 DIAGNOSIS — M623 Immobility syndrome (paraplegic): Secondary | ICD-10-CM

## 2014-11-19 DIAGNOSIS — Z7409 Other reduced mobility: Secondary | ICD-10-CM

## 2014-11-19 DIAGNOSIS — M2560 Stiffness of unspecified joint, not elsewhere classified: Secondary | ICD-10-CM

## 2014-11-19 NOTE — Patient Instructions (Signed)
Hand out given for LTR, standing scap retraction and FWD lean into back stretch holding onto counter

## 2014-11-19 NOTE — Therapy (Signed)
[facility] [place] [address] [place], [place], [address] Phone: [phone]   Fax:  [phone]  Physical Therapy Treatment  Patient Details  Name: Marc Gonzalez MRN: [id number] Date of Birth: 03/01/1970 Referring Provider:  Rodolph Bong, MD  Encounter Date: 11/19/2014      PT End of Session - 11/19/14 0939    Visit Number 2   Number of Visits 12   PT Start Time 0939   PT Stop Time 1037   PT Time Calculation (min) 58 min   Activity Tolerance Patient limited by pain      Past Medical History  Diagnosis Date  . Paralysis [facility]     Past Surgical History  Procedure Laterality Date  . Hand surgery      There were no vitals filed for this visit.  Visit Diagnosis:  Pain in thoracic spine at multiple sites  Low back pain at multiple sites  Stiffness due to immobility  Decreased functional mobility and endurance      Subjective Assessment - 11/19/14 0943    Subjective Pt reports that since his last visit things have been basically the same. He is having an MRI on Saturday.    Patient is accompained by: Interpreter   Pertinent History Problems sleeping also, tolerates about ~ 1 hr.    How long can you sit comfortably? 10-20 min   How long can you stand comfortably? 5-10 min   How long can you walk comfortably? 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    Currently in Pain? Yes   Pain Score 7    Pain Location Thoracic   Pain Type Acute pain   Pain Radiating Towards down the Rt LE with walking some times.    Pain Onset More than a month ago   Pain Frequency Intermittent   Aggravating Factors  walking, moving , sleeping   Pain Relieving Factors meds and ice   Multiple Pain Sites Yes   Pain Score 4   Pain Location Back   Pain Orientation Mid;Lower   Pain Descriptors / Indicators Aching   Pain Onset  More than a month ago   Aggravating Factors  same as above   Pain Relieving Factors same as above            [facility] PT Assessment - 11/19/14 0001    Assessment   Medical Diagnosis thoracic/low back pain    Onset Date/Surgical Date 05/25/14   Hand Dominance Right   Next MD Visit 12/01/14   Prior Therapy for neck ~ 2 month ago PT with no change - 7 visits    Observation/Other Assessments   Focus on Therapeutic Outcomes (FOTO)  71% limited   AROM   Overall AROM Comments thoracic flexion decreased 25%, ext 75% bilat rotation50%    Strength   Overall Strength Comments knees/ankles WNL, Lt UE grossly 5/5, Rt shoulder 5-/5   Palpation   Palpation comment very tight in lower thoracic paraspinals with tenderness, tender in lumbar                      [facility] Adult PT Treatment/Exercise - 11/19/14 0001    Exercises   Exercises Lumbar   Lumbar Exercises: Stretches   Lower Trunk Rotation --  10 reps   Lumbar Exercises: Standing   Other Standing Lumbar Exercises standing scap retraction  Other Standing Lumbar Exercises standing forward lean holding on to counter for back stretch - required many verbal cues    Modalities   Modalities Ultrasound   Ultrasound   Ultrasound Location Rt lower thoracic paraspinals   Ultrasound Parameters combo, 100%, 3.66mhz, 1.5 w/cm2   Ultrasound Goals Pain  tone   Manual Therapy   Manual Therapy Soft tissue mobilization;Myofascial release;Joint mobilization   Manual therapy comments pt is hypersenstive to touch all over his back.    Joint Mobilization grade II mobs as tolerated   Soft tissue mobilization to mid/low thoracic with deep pressure   Myofascial Release to back paraspinals                PT Education - 11/19/14 1234    Education provided Yes   Education Details HEP and discussion on importance of attending scheduled PT sessions along with the importance of gentle motion to decrease muscular spasms   Person(s) Educated  Patient;Other (comment)  interpretrer   Methods Explanation;Demonstration;Tactile cues;Verbal cues;Handout   Comprehension Returned demonstration;Verbalized understanding             PT Long Term Goals - 11/19/14 1238    PT LONG TERM GOAL #1   Title Patient I in HEP for discharge 12/04/14   Status On-going   PT LONG TERM GOAL #2   Title Patient to demonstrate 75% of normal trunk ROM 12/04/14   Status On-going   PT LONG TERM GOAL #3   Title Patient to tolerate sitting for 30 minutes 12/04/14   Status On-going   PT LONG TERM GOAL #4   Title Patient to tolerate standing/walking for 30-45 min 12/04/14   Status On-going   PT LONG TERM GOAL #5   Title Decrease FOTO to </= 52% limitatiion 12/04/14   Status On-going               Plan - 11/19/14 1236    Clinical Impression Statement Pt returns to therapy after missing multiple scheduled appointments.  Informed patient that he wasn't going to improve if he didn't attend sessions and perform his HEP.  He verbalized understanding through the interpreter. He has very tight musculatur with a lot of guarding and protective posturing.  He needs gentle movement to break this up along with manual work and modalities.    Pt will benefit from skilled therapeutic intervention in order to improve on the following deficits Pain;Decreased range of motion;Decreased mobility;Decreased endurance;Decreased activity tolerance   Rehab Potential Good   PT Frequency 2x / week   PT Duration 6 weeks   PT Treatment/Interventions Patient/family education;ADLs/Self Care Home Management;Therapeutic exercise;Therapeutic activities;Cryotherapy;Electrical Stimulation;Iontophoresis /ml Dexamethasone;Moist Heat;Ultrasound;Dry needling   PT Next Visit Plan STW, modalities and gentle stretching.    Consulted and Agree with Plan of Care Patient        Problem List Patient Active Problem List   Diagnosis Date Noted  . 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  Thoracic back pain 10/14/2014  . Lumbago 10/14/2014    Roderic Scarce PT 11/19/2014, 12:39 PM  [facility] [address] [place], [place], [address] Phone: [phone]   Fax:  [phone]

## 2014-11-22 ENCOUNTER — Ambulatory Visit (INDEPENDENT_AMBULATORY_CARE_PROVIDER_SITE_OTHER): Payer: Self-pay | Admitting: Physical Therapy

## 2014-11-22 DIAGNOSIS — M545 Low back pain, unspecified: Secondary | ICD-10-CM

## 2014-11-22 DIAGNOSIS — M546 Pain in thoracic spine: Secondary | ICD-10-CM

## 2014-11-22 DIAGNOSIS — M2560 Stiffness of unspecified joint, not elsewhere classified: Secondary | ICD-10-CM

## 2014-11-22 DIAGNOSIS — M623 Immobility syndrome (paraplegic): Secondary | ICD-10-CM

## 2014-11-22 DIAGNOSIS — Z7409 Other reduced mobility: Secondary | ICD-10-CM

## 2014-11-22 NOTE — Therapy (Addendum)
[place] [place] [facility], [place], [address] Phone: [phone]   Fax:  [phone]  Physical Therapy Treatment  Patient Details  Name: Marc Gonzalez MRN: [id number] Date of Birth: 1970-10-13 Referring Provider:  Gregor Hams, MD  Encounter Date: 11/22/2014      PT End of Session - 11/22/14 1149    Visit Number 3   Number of Visits 12   Date for PT Re-Evaluation 12/04/14   PT Start Time 9798   PT Stop Time 1250   PT Time Calculation (min) 65 min   Activity Tolerance Patient limited by pain      Past Medical History  Diagnosis Date  . Paralysis [facility])     Past Surgical History  Procedure Laterality Date  . Hand surgery      There were no vitals filed for this visit.  Visit Diagnosis:  Pain in thoracic spine at multiple sites  Low back pain at multiple sites  Stiffness due to immobility  Decreased functional mobility and endurance      Subjective Assessment - 11/22/14 1149    Subjective Pt reports slight improvement since last visit, exercises at home are going well. "Pain in middle of my back comes and goes" "My hurt (Rt) foot is making my back hurt"    Patient is accompained by: Interpreter   Currently in Pain? Yes   Pain Score 5    Pain Location Back   Pain Orientation Lower;Mid   Pain Descriptors / Indicators Sharp   Aggravating Factors  walking.    Pain Relieving Factors medication, ice    Effect of Pain on Daily Activities unable to work             [facility] PT Assessment - 11/22/14 0001    Assessment   Medical Diagnosis thoracic/low back pain    Onset Date/Surgical Date 05/25/14   Hand Dominance Right   Next MD Visit 12/01/14            [facility] Adult PT Treatment/Exercise - 11/22/14 0001    Lumbar Exercises: Stretches   Single Knee to Chest Stretch 3 reps   Single Knee to Chest Stretch Limitations Pt winced, reported increased discomfort.    Lower Trunk Rotation --  15  sec hold, 4 reps each side    Prone on Elbows Stretch 1 rep;20 seconds   Prone on Elbows Stretch Limitations Pt reported increased pain in this position.    Lumbar Exercises: Aerobic   Stationary Bike NuStep L4: 6 min (arms and LLE- Rt foot resting off of machine)   Lumbar Exercises: Standing   Other Standing Lumbar Exercises standing scap retraction against door edge x 5 sec hold x 10 reps    Other Standing Lumbar Exercises standing forward lean holding on to counter for back stretch - required many verbal cues (modified childs pose/lat stretch with hands on back of truck) x 15 sec x 5 reps   Lumbar Exercises: Supine   Ab Set 5 seconds;10 reps   Clam 5 reps;1 second  each side   Bent Knee Raise 10 reps  with ab set   Lumbar Exercises: Sidelying   Hip Abduction 10 reps   Hip Abduction Weights (lbs) only able to complete 6 reps on LLE due to increased LBP.     Modalities   Modalities Moist Heat;Electrical Stimulation   Moist Heat Therapy   Number Minutes Moist Heat 15 Minutes   Moist Heat Location Lumbar Spine  thoracic  Theme park manager Action IFC   Electrical Stimulation Parameters to tolerance    Electrical Stimulation Goals Pain          PT Education - 11/22/14 1254    Education provided Yes   Education Details HEP- add trans abd serier (english version due to lack of illustrations in Epic spanish version).  Self care- issued information on TENS unit.    Person(s) Educated Patient;Other (comment)  interpreter.    Methods Explanation;Demonstration   Comprehension Verbalized understanding;Returned demonstration             PT Long Term Goals - 11/19/14 1238    PT LONG TERM GOAL #1   Title Patient I in HEP for discharge 12/04/14   Status On-going   PT LONG TERM GOAL #2   Title Patient to demonstrate 75% of normal trunk ROM 12/04/14   Status On-going   PT LONG TERM GOAL #3   Title Patient to  tolerate sitting for 30 minutes 12/04/14   Status On-going   PT LONG TERM GOAL #4   Title Patient to tolerate standing/walking for 30-45 min 12/04/14   Status On-going   PT LONG TERM GOAL #5   Title Decrease FOTO to </= 52% limitatiion 12/04/14   Status On-going               Plan - 11/22/14 1239    Clinical Impression Statement Pt reported discomfort with both lumbar flex and extension.  Pt tolerated supine transverse abdominis exercises with frequent cues, without increase in pain.  Pain in Rt foot is an additional barrier to progress. Pt reported decreased pain in back after session.    Pt will benefit from skilled therapeutic intervention in order to improve on the following deficits Pain;Decreased range of motion;Decreased mobility;Decreased endurance;Decreased activity tolerance   Rehab Potential Good   PT Frequency 2x / week   PT Duration 6 weeks   PT Treatment/Interventions Patient/family education;ADLs/Self Care Home Management;Therapeutic exercise;Therapeutic activities;Cryotherapy;Electrical Stimulation;Iontophoresis 15m/ml Dexamethasone;Moist Heat;Ultrasound;Dry needling   PT Next Visit Plan continue core strengthening, gentle stretching, posture / body mechanics education.    PT Home Exercise Plan to begin increasing activity level; work on improved body mechanics; check on TENS unit for pain management   Consulted and Agree with Plan of Care Patient        Problem List Patient Active Problem List   Diagnosis Date Noted  . 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 Thoracic back pain 10/14/2014  . Lumbago 10/14/2014    JKerin Perna PTA 11/22/2014 12:57 PM  [address] [address]Phone: [phone]  Fax:  [phone]    PHYSICAL THERAPY DISCHARGE SUMMARY  Visits from Start of Care: 3  Current functional level related to goals / functional  outcomes: Continued pain and limitations   Remaining deficits; No significant changes   Education / Equipment: HEP; education re movement/dysfunction  Plan: Patient agrees to discharge.  Patient goals were not met. Patient is being discharged due to not returning since the last visit.  ?????   Celyn P. HHelene KelpPT, MPH 12/11/2014 12:07 PM

## 2014-11-23 ENCOUNTER — Ambulatory Visit (HOSPITAL_BASED_OUTPATIENT_CLINIC_OR_DEPARTMENT_OTHER): Payer: Self-pay

## 2014-11-23 ENCOUNTER — Ambulatory Visit (HOSPITAL_BASED_OUTPATIENT_CLINIC_OR_DEPARTMENT_OTHER)
Admission: RE | Admit: 2014-11-23 | Discharge: 2014-11-23 | Disposition: A | Discharge status: Home / Self Care | Payer: Self-pay | Source: Ambulatory Visit | Attending: Family Medicine | Admitting: Family Medicine

## 2014-11-23 DIAGNOSIS — M545 Low back pain, unspecified: Secondary | ICD-10-CM

## 2014-11-23 DIAGNOSIS — M546 Pain in thoracic spine: Secondary | ICD-10-CM | POA: Insufficient documentation

## 2014-11-23 DIAGNOSIS — E042 Nontoxic multinodular goiter: Secondary | ICD-10-CM | POA: Insufficient documentation

## 2014-11-23 DIAGNOSIS — M79604 Pain in right leg: Secondary | ICD-10-CM | POA: Insufficient documentation

## 2014-11-25 ENCOUNTER — Encounter: Payer: Self-pay | Admitting: Family Medicine

## 2014-11-25 ENCOUNTER — Ambulatory Visit (INDEPENDENT_AMBULATORY_CARE_PROVIDER_SITE_OTHER): Payer: Self-pay | Admitting: Family Medicine

## 2014-11-25 ENCOUNTER — Encounter: Payer: Self-pay | Admitting: Rehabilitative and Restorative Service Providers"

## 2014-11-25 VITALS — BP 145/78 | HR 89 | Wt 207.0 lb

## 2014-11-25 DIAGNOSIS — E041 Nontoxic single thyroid nodule: Secondary | ICD-10-CM

## 2014-11-25 DIAGNOSIS — G8929 Other chronic pain: Secondary | ICD-10-CM

## 2014-11-25 DIAGNOSIS — I8393 Asymptomatic varicose veins of bilateral lower extremities: Secondary | ICD-10-CM

## 2014-11-25 DIAGNOSIS — M546 Pain in thoracic spine: Secondary | ICD-10-CM

## 2014-11-25 DIAGNOSIS — M5441 Lumbago with sciatica, right side: Secondary | ICD-10-CM

## 2014-11-25 DIAGNOSIS — I8390 Asymptomatic varicose veins of unspecified lower extremity: Secondary | ICD-10-CM

## 2014-11-25 NOTE — Assessment & Plan Note (Signed)
Patient declined workup of this issue at this time. Will follow-up. I stressed the importance of workup at this incidentally discovered finding in the near future

## 2014-11-25 NOTE — Assessment & Plan Note (Signed)
Vascular ultrasound pending

## 2014-11-25 NOTE — Assessment & Plan Note (Signed)
At this point chronic. Refer for PT functional capacity evaluation

## 2014-11-25 NOTE — Patient Instructions (Signed)
Thank you for coming in today. 1) I recommend that you have the thyroid nodules fully evaluated with an ultrasound and blood work.  2) You should hear from physical therapy soon for a functional capacity evaluation 3) You should also hear from vascular imaging to evaluate the possible varicose veins of the right leg.  Return in 2-3 weeks.   Venas varicosas (Varicose Veins) Las venas varicosas son venas que se han agrandado y tornado sinuosas. Suelen aparecer [employer] piernas, pero tambin pueden verse en otra parte del cuerpo. CAUSAS Esta afeccin se presenta como consecuencia del mal funcionamiento de las vlvulas de las venas, las cuales ayudan al retorno de la sangre desde las piernas hacia el corazn. Si estas vlvulas se daan, la sangre retrocede y regresa a las venas de la pierna, cerca de la superficie de la piel, lo que causa dilatacin venosa. FACTORES DE RIESGO Las personas que estn mucho tiempo paradas, las embarazadas o las personas con sobrepeso tienen ms probabilidades de tener venas varicosas. SIGNOS Y SNTOMAS  Venas abultadas, azuladas y de aspecto sinuoso que se observan con mayor frecuencia en las piernas.  Dolor o sensacin de Development worker, community las piernas. Estos sntomas pueden empeorar al final del da.  Hinchazn de las piernas.  Cambios en el color de la piel. DIAGNSTICO Generalmente, el mdico puede diagnosticar las venas varicosas al examinarle las piernas. Adems, puede recomendarle que se haga una ecografa de las venas de las piernas. TRATAMIENTO La mayora de las venas varicosas pueden tratarse en casa. Sin embargo, hay otros tratamientos a disposicin de [employer] tienen sntomas persistentes o desean mejorar la apariencia esttica de las venas varicosas. Estas opciones de tratamiento incluyen lo siguiente:  Escleroterapia. Se inyecta una solucin en la vena para anularla.  Tratamiento con lser. Se [place] un rayo lser para calentar la vena y  anularla.  Ablacin venosa por radiofrecuencia Se [place] una corriente elctrica que se produce mediante ondas de radio para anular la vena.  Flebectoma. Se extirpa quirrgicamente la vena a travs de pequeas incisiones que se hacen sobre la vena varicosa.  Ligadura venosa y varicectoma. La vena se extirpa quirrgicamente a travs de incisiones que se realizan sobre la vena varicosa despus de haberla anudado (ligado). INSTRUCCIONES PARA EL CUIDADO EN EL HOGAR  No permanezca sentado o de pie en una posicin durante mucho tiempo. No se siente con las piernas cruzadas. Descanse con las piernas Radiation protection practitioner.  Use medias de compresin como le haya indicado su mdico. Estas medias ayudan a evitar la formacin de cogulos sanguneos y a Building services engineer de las piernas.  No use otras prendas que le ajusten todo el contorno de las piernas, la pelvis o la cintura.  Camine todo lo posible para aumentar la circulacin de Risk manager.  A la noche, eleve el pie de la cama con bloques de 2pulgadas.  Si tiene un corte en la piel sobre la vena y la vena sangra, recustese con la pierna elevada y [place] presin en el lugar con un pao limpio, hasta que deje de Geophysicist/field seismologist. Luego aplique un apsito (vendaje) sobre el corte. Consulte al mdico si el sangrado contina. SOLICITE ATENCIN MDICA SI:  La piel alrededor del tobillo empieza a Lobbyist.  Siente dolor, hay enrojecimiento, sensibilidad o hinchazn dura en la pierna sobre una vena.  Est incmodo debido al dolor de la pierna.   Esta informacin no tiene Theme park manager el consejo del mdico. Asegrese de hacerle al mdico  cualquier pregunta que tenga.   Document Released: 11/11/2004 Document Revised: 02/22/2014 Elsevier Interactive Patient Education [employer]
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
## 2014-11-25 NOTE — Assessment & Plan Note (Signed)
At this point chronic. Refer for PT functional capacity evaluation  

## 2014-11-26 ENCOUNTER — Encounter: Payer: Self-pay | Admitting: Physical Therapy

## 2014-11-27 ENCOUNTER — Other Ambulatory Visit: Payer: Self-pay | Admitting: Family Medicine

## 2014-11-27 DIAGNOSIS — M79604 Pain in right leg: Secondary | ICD-10-CM

## 2014-11-27 DIAGNOSIS — I8390 Asymptomatic varicose veins of unspecified lower extremity: Secondary | ICD-10-CM

## 2014-12-03 ENCOUNTER — Inpatient Hospital Stay (HOSPITAL_COMMUNITY): Admission: RE | Admit: 2014-12-03 | Payer: Self-pay | Source: Ambulatory Visit

## 2014-12-03 DIAGNOSIS — R0989 Other specified symptoms and signs involving the circulatory and respiratory systems: Secondary | ICD-10-CM

## 2014-12-19 ENCOUNTER — Ambulatory Visit (INDEPENDENT_AMBULATORY_CARE_PROVIDER_SITE_OTHER): Payer: Self-pay | Admitting: Family Medicine

## 2014-12-19 ENCOUNTER — Encounter: Payer: Self-pay | Admitting: Family Medicine

## 2014-12-19 VITALS — BP 146/73 | HR 75 | Wt 205.0 lb

## 2014-12-19 DIAGNOSIS — I8393 Asymptomatic varicose veins of bilateral lower extremities: Secondary | ICD-10-CM

## 2014-12-19 DIAGNOSIS — M545 Low back pain, unspecified: Secondary | ICD-10-CM

## 2014-12-19 DIAGNOSIS — E041 Nontoxic single thyroid nodule: Secondary | ICD-10-CM

## 2014-12-19 DIAGNOSIS — K148 Other diseases of tongue: Secondary | ICD-10-CM | POA: Insufficient documentation

## 2014-12-19 DIAGNOSIS — R220 Localized swelling, mass and lump, head: Secondary | ICD-10-CM

## 2014-12-19 DIAGNOSIS — I8390 Asymptomatic varicose veins of unspecified lower extremity: Secondary | ICD-10-CM

## 2014-12-19 DIAGNOSIS — G8929 Other chronic pain: Secondary | ICD-10-CM

## 2014-12-19 DIAGNOSIS — I781 Nevus, non-neoplastic: Secondary | ICD-10-CM | POA: Insufficient documentation

## 2014-12-19 LAB — CBC
HEMATOCRIT: 45.4 % (ref 39.0–52.0)
HEMOGLOBIN: 15.4 g/dL (ref 13.0–17.0)
MCH: 28.6 pg (ref 26.0–34.0)
MCHC: 33.9 g/dL (ref 30.0–36.0)
MCV: 84.2 fL (ref 78.0–100.0)
MPV: 10.6 fL (ref 8.6–12.4)
PLATELETS: 220 10*3/uL (ref 150–400)
RBC: 5.39 MIL/uL (ref 4.22–5.81)
RDW: 13.8 % (ref 11.5–15.5)
WBC: 5.2 10*3/uL (ref 4.0–10.5)

## 2014-12-19 LAB — COMPLETE METABOLIC PANEL WITH GFR
ALBUMIN: 4.1 g/dL (ref 3.6–5.1)
ALT: 46 U/L (ref 9–46)
AST: 35 U/L (ref 10–40)
Alkaline Phosphatase: 86 U/L (ref 40–115)
BILIRUBIN TOTAL: 1 mg/dL (ref 0.2–1.2)
BUN: 14 mg/dL (ref 7–25)
CO2: 27 mmol/L (ref 20–31)
CREATININE: 0.9 mg/dL (ref 0.60–1.35)
Calcium: 9.4 mg/dL (ref 8.6–10.3)
Chloride: 106 mmol/L (ref 98–110)
GFR, Est Non African American: >89 mL/min (ref >=60)
Glucose, Bld: 117 mg/dL — ABNORMAL HIGH (ref 65–99)
Potassium: 4.2 mmol/L (ref 3.5–5.3)
Sodium: 140 mmol/L (ref 135–146)
TOTAL PROTEIN: 6.7 g/dL (ref 6.1–8.1)

## 2014-12-19 LAB — T3: T3, Total: 145.1 ng/dL (ref 80.0–204.0)

## 2014-12-19 LAB — TSH: TSH: 0.506 u[IU]/mL (ref 0.350–4.500)

## 2014-12-19 LAB — T4, FREE: FREE T4: 1.11 ng/dL (ref 0.80–1.80)
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
## 2014-12-19 NOTE — Assessment & Plan Note (Signed)
Likely benign. Patient declined referral to ENT

## 2014-12-19 NOTE — Assessment & Plan Note (Signed)
Improved. Continue to follow

## 2014-12-19 NOTE — Assessment & Plan Note (Signed)
Benign-appearing. No superficial spreading. we'll continue to follow

## 2014-12-19 NOTE — Patient Instructions (Signed)
Thank you for coming in today. Return following tests to discuss results.  We will call for the appointments for both ultrasounds.   Ndulos tiroideos (Thyroid Nodule) Un ndulo tiroideo es un crecimiento aislado de clulas tiroideas que forman un bulto en la glndula tiroidea. La glndula tiroidea tiene forma de [place] y est [place]ubicada en la parte delantera inferior del cuello. Esta glndula enva mensajeros qumicos (hormonas) a travs de la sangre a todo el organismo. Estas hormonas son importantes en la regulacin de la temperatura corporal y ayudan al cuerpo a Software engineerusar la energa. Los ndulos tiroideos son frecuentes. Katha HammingLa mayora no son cancerosos (son benignos). Una persona puede tener uno o varios ndulos.  Hay diferentes tipos de ndulos tiroideos, entre ellos:  Ndulos que crecen y se llenan de lquido (quistes tiroideos).  Ndulos que producen una cantidad excesiva de hormona tiroidea (ndulos calientes o hipertiroideos).  Ndulos que no producen hormona tiroidea (ndulos fros o hipotiroideos).  Ndulos que se forman a partir de Scientist, clinical (histocompatibility and immunogenetics)clulas cancerosas (cncer de tiroides). CAUSAS Por lo general, se desconoce la causa de esta afeccin. FACTORES DE RIESGO Los factores que aumentan la probabilidad de que esta afeccin se desarrolle incluyen lo siguiente:  La edad. Los ndulos tiroideos son ms frecuentes en las Smith Internationalpersonas mayores de [place]45aos.  El sexo.  Los ndulos tiroideos benignos son ms frecuentes en las mujeres,  y los cancerosos (malignos), en los hombres.  Antecedentes familiares que abarcan lo siguiente:  Ndulos tiroideos.  Feocromocitoma.  Carcinoma de tiroides.  Hiperparatiroidismo.  Determinados tipos de enfermedades tiroideas, como tiroiditis de [place] RidgeHashimoto.  Carencia de yodo.  Antecedentes de radiacin en la cabeza y el cuello, por ejemplo, rayosX. SNTOMAS Es frecuente que esta afeccin no cause sntomas. Si tiene sntomas, estos pueden Johnson & Johnsonincluir los  siguientes:  Un bulto en la parte inferior del cuello.  Sensacin de tener un bulto o un cosquilleo en la garganta.  Dolor en el cuello, la mandbula o el odo.  Dificultad para tragar. Los ndulos calientes pueden causar algunos de los siguientes sntomas:  Prdida de Hernandopeso.  Piel enrojecida y caliente.  Sensacin de calor.  Nerviosismo.  Frecuencia cardaca acelerada. Los ndulos fros pueden causar algunos de los siguientes sntomas:  Aumento de Clintpeso.  Piel seca.  Debilitamiento del cabello que tambin puede presentarse con la cada del cabello.  Sensacin de fro.  Fatiga. Los ndulos tiroideos cancerosos pueden causar BorgWarneralgunos de los siguientes sntomas:  Ndulos duros que parecen estar adheridos a la glndula tiroidea.  Ronquera.  Bultos en los ganglios cercanos a la tiroides (ganglios linfticos). DIAGNSTICO El mdico puede palpar un ndulo tiroideo durante un examen fsico. Esta afeccin tambin se puede diagnosticar en funcin de los sntomas. Tambin pueden hacerle exmenes que incluyen los siguientes:  Hydrologistcografa. Se puede realizar para Optometristconfirmar el diagnstico.  Biopsia. Se toma una muestra del ndulo y se la examina con un microscopio para determinar si el ndulo es benigno.  Anlisis de sangre para saber si la tiroides funciona correctamente.  Se pueden realizar estudios de diagnstico por imgenes, como una resonancia magntica (RM) o una tomografa computarizada (TC) en los siguientes casos:  El ndulo tiroideo es grande.  El ndulo tiroideo est obstruyendo las vas respiratorias.  Se sospecha la presencia de cncer. TRATAMIENTO El tratamiento depende de la causa y del tamao del o de los ndulos. Si el ndulo es benigno, tal vez no se necesite tratamiento. El mdico puede controlar el ndulo para saber si desaparece sin tratamiento. Si el ndulo sigue creciendo,  es canceroso o no desaparece:  Tal vez haya que drenarlo con una aguja.  Es  posible que haya que extirparlo con [place]. Si se somete a [place], puede que tambin haya que extirpar una parte o la totalidad de la glndula tiroides. INSTRUCCIONES PARA EL CUIDADO EN EL HOGAR  Est atento a cualquier cambio en el ndulo.  Tome los medicamentos de venta libre y los recetados solamente como se lo haya indicado el mdico.  Oceanographer a todas las visitas de control como se lo haya indicado el mdico. Esto es importante. SOLICITE ATENCIN MDICA SI:  Le cambia la voz.  Presenta dificultad para tragar.  Siente dolor en el cuello, el odo o la mandbula que se vuelve ms intenso.  El ndulo se agranda.  El ndulo empieza a dificultarle la respiracin. SOLICITE ATENCIN MDICA DE INMEDIATO SI:  Tiene fiebre que aparece de repente.  Se siente muy dbil.  Parece que los msculos se le estn encogiendo (prdida de la masa muscular).  Tiene cambios en el estado de nimo.  Est muy inquieto.  Se siente confundido.  Ve u oye cosas que otras personas no ven ni oyen (tiene alucinaciones).  Tiene nuseas que aparecen de repente o vomita.  Tiene diarrea sbitamente.  Siente dolor en el pecho.  Pierde la conciencia.   Esta informacin no tiene Theme park manager el consejo del mdico. Asegrese de hacerle al mdico cualquier pregunta que tenga.   Document Released: 01/19/2012 Document Revised: 10/23/2014 Elsevier Interactive Patient Education [employer]

## 2014-12-19 NOTE — Assessment & Plan Note (Signed)
Seated with workup including thyroid labs metabolic panel CBC and a ultrasound of the thyroid. Follow-up after ultrasound performed

## 2014-12-20 NOTE — Progress Notes (Signed)
Quick Note:  Thyroid labs are completely normal. This is a good thing. We are waiting on the ultrasound. ______

## 2014-12-25 ENCOUNTER — Inpatient Hospital Stay (HOSPITAL_COMMUNITY): Admission: RE | Admit: 2014-12-25 | Payer: Self-pay | Source: Ambulatory Visit

## 2014-12-27 ENCOUNTER — Other Ambulatory Visit: Payer: Self-pay

## 2016-01-19 DIAGNOSIS — R079 Chest pain, unspecified: Secondary | ICD-10-CM | POA: Insufficient documentation

## 2016-01-22 ENCOUNTER — Encounter: Payer: Self-pay | Admitting: *Deleted

## 2016-01-22 ENCOUNTER — Emergency Department (INDEPENDENT_AMBULATORY_CARE_PROVIDER_SITE_OTHER)
Admission: EM | Admit: 2016-01-22 | Discharge: 2016-01-22 | Disposition: A | Discharge status: Home / Self Care | Payer: Self-pay | Source: Home / Self Care | Attending: Family Medicine | Admitting: Family Medicine

## 2016-01-22 ENCOUNTER — Other Ambulatory Visit: Payer: Self-pay

## 2016-01-22 DIAGNOSIS — R200 Anesthesia of skin: Secondary | ICD-10-CM

## 2016-01-22 DIAGNOSIS — R202 Paresthesia of skin: Secondary | ICD-10-CM

## 2016-01-22 DIAGNOSIS — H02402 Unspecified ptosis of left eyelid: Secondary | ICD-10-CM

## 2016-01-22 DIAGNOSIS — R079 Chest pain, unspecified: Secondary | ICD-10-CM

## 2016-01-22 HISTORY — DX: Essential (primary) hypertension: I10

## 2016-01-22 MED ORDER — ASPIRIN 81 MG PO CHEW
162.0000 mg | CHEWABLE_TABLET | Freq: Once | ORAL | Status: AC
  Administered 2016-01-22: 162 mg via ORAL

## 2016-01-22 NOTE — ED Triage Notes (Signed)
[facility] with CP and htn. Reports he still had chest pain at discharge but awoke with worsening CP. Took prescribed amlodipine and 81 mg ASA this AM. C/o some SOB and 3 weeks of left facial numbness and left finger tingling. EKG done, provider notified.

## 2016-01-22 NOTE — ED Provider Notes (Signed)
CSN: [id number]     Arrival date & time 01/22/16  1149 History   First MD Initiated Contact with Patient 01/22/16 1200     Chief Complaint  Patient presents with  . Chest Pain  . Facial Droop   (Consider location/radiation/quality/duration/timing/severity/associated sxs/prior Treatment) HPI Marc Gonzalez is a 45 y.o. male presenting to UC with c/o Left sided sharp constant chest pain that started at [facility]5AM when he woke up.  He also reports Left sided facial numbness and mild pain.  Per Care Everywhere, pt was admitted on 01/19/16 and discharged 01/20/16 for chest pain r/o. He had a normal stress test and was discharged home with amlodipine and advised to take 81mg  aspirin daily.  The aspirin did help minimally this morning but pain is still 8/10.  Nothing makes it better or worse. Pt initially stated chest pain and facial symptoms have been going on for several weeks but then stated it was new and worse this morning.  Denies SOB.  Pt concerned about Left side of his face and head.  He notes he did have a stroke about 13 years ago but is unsure if he had residual Left sided numbness or weakness.  He has not f/u with PCP since recent hospital admission at [facility].    Past Medical History:  Diagnosis Date  . Hypertension   . Paralysis [facility](HCC)    Past Surgical History:  Procedure Laterality Date  . HAND SURGERY     Family History  Problem Relation Age of Onset  . Diabetes Mother   . Hypertension Mother    Social History  Substance Use Topics  . Smoking status: Current Every Day Smoker    Packs/day: 0.50    Types: Cigarettes  . Smokeless tobacco: Never Used  . Alcohol use No    Review of Systems  Constitutional: Negative for chills and fever.  HENT: Negative for congestion, ear pain, sore throat, trouble swallowing and voice change.   Respiratory: Positive for chest tightness. Negative for cough and shortness of breath.   Cardiovascular: Positive for chest pain. Negative  for palpitations.  Gastrointestinal: Negative for abdominal pain, diarrhea, nausea and vomiting.  Musculoskeletal: Negative for arthralgias, back pain and myalgias.  Skin: Negative for rash.  Neurological: Positive for facial asymmetry, numbness ( Left side face and hand) and headaches ( Left side). Negative for dizziness, tremors, seizures, syncope, speech difficulty, weakness and light-headedness.    Allergies  Patient has no known allergies.  Home Medications   Prior to Admission medications   Medication Sig Start Date End Date Taking? Authorizing Provider  amLODipine (NORVASC) 5 MG tablet Take 5 mg by mouth daily.   Yes Historical Provider, MD  aspirin 81 MG chewable tablet Chew by mouth daily.   Yes Historical Provider, MD   Meds Ordered and Administered this Visit   Medications  aspirin chewable tablet 162 mg (162 mg Oral Given 01/22/16 1208)    BP 134/85 (BP Location: Left Arm)   Pulse 84   SpO2 97%  No data found.   Physical Exam  Constitutional: He is oriented to person, place, and time. He appears well-developed and well-nourished. No distress.  Pt lying on exam bed, appears mildly anxious. Cooperative during exam.  HENT:  Head: Normocephalic and atraumatic.  Right Ear: Tympanic membrane normal.  Left Ear: Tympanic membrane normal.  Nose: Nose normal.  Mouth/Throat: Uvula is midline, oropharynx is clear and moist and mucous membranes are normal.  Eyes: EOM are normal.  Neck: Normal  range of motion. Neck supple.  Cardiovascular: Normal rate and regular rhythm.   Pulses:      Radial pulses are 2+ on the right side, and 2+ on the left side.  Pulmonary/Chest: Effort normal and breath sounds normal. No respiratory distress. He has no wheezes. He has no rales. He exhibits no tenderness.  Abdominal: Soft. He exhibits no distension. There is no tenderness.  Musculoskeletal: Normal range of motion.  Neurological: He is alert and oriented to person, place, and time. A  cranial nerve deficit is present.  Slight Left sided facial droop compared to Left. Altered sensation to light and sharp touch on Left side of face compared to Right side. Speech is clear. Slight droop on Left side of smile.  Intermittent twitching of Left lower side of face.  Full ROM upper and lower extremities. 5/5 strength with flexion at elbow of both arms but 4/5 strength in Left arm vs Right with extension against resistance.   Skin: Skin is warm and dry. No rash noted. He is not diaphoretic. No erythema.  Psychiatric: His behavior is normal. His mood appears anxious.  Nursing note and vitals reviewed.   Urgent Care Course   Clinical Course     Procedures (including critical care time)  Labs Review Labs Reviewed - No data to display  Imaging Review No results found.  Date/Time:01/22/16   11:57:15 Ventricular Rate: 87 PR Interval: 130 QRS Duration: 78 QT Interval: 336 QTC Calculation: 404 P-R-T axes: 69   -1    7  Text Interpretation: Normal sinus rhythm, cannot r/o inferior infarct, age undetermined. Abnormal EKG.  Unable to compare EKG images, only able to see text interpretation from [facility] from 01/19/16- normal EKG    MDM   1. Numbness and tingling of left side of face   2. Drooping eyelid, left   3. Left sided chest pain    Pt c/o Left side chest pain that started when he woke at [facility]AM as well as Left sided facial tingling and numbness.    He notes he had a stroke about 13 years ago but its unclear if Left side facial twitching and droop is new or old.  Pt also has slight decreased strength in Left arm with extension against resistance compared to Right.  Concern for possible CVA vs TIA Lower concern for chest pain due to recent normal stress test. Still recommend pt go to ED via EMS for further evaluated for likely head scan.  Pt transferred via EMS in stable [facility] to [facility].    [place]Mal[place], [place] 01/22/16  1400

## 2016-01-25 ENCOUNTER — Telehealth: Payer: Self-pay | Admitting: Emergency Medicine

## 2016-01-25 NOTE — Telephone Encounter (Signed)
Patient states he is doing well; was discharged from the hospital and told he was without stroke or other problems.

## 2016-02-02 DIAGNOSIS — E781 Pure hyperglyceridemia: Secondary | ICD-10-CM | POA: Insufficient documentation

## 2016-02-02 DIAGNOSIS — G510 Bell's palsy: Secondary | ICD-10-CM | POA: Insufficient documentation

## 2016-02-02 DIAGNOSIS — I10 Essential (primary) hypertension: Secondary | ICD-10-CM | POA: Insufficient documentation
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
## 2016-07-31 ENCOUNTER — Emergency Department
Admission: EM | Admit: 2016-07-31 | Discharge: 2016-07-31 | Disposition: A | Discharge status: Home / Self Care | Payer: Self-pay | Source: Home / Self Care | Attending: Family Medicine | Admitting: Family Medicine

## 2016-07-31 ENCOUNTER — Encounter: Payer: Self-pay | Admitting: Emergency Medicine

## 2016-07-31 DIAGNOSIS — I10 Essential (primary) hypertension: Secondary | ICD-10-CM

## 2016-07-31 DIAGNOSIS — R202 Paresthesia of skin: Secondary | ICD-10-CM

## 2016-07-31 DIAGNOSIS — Z760 Encounter for issue of repeat prescription: Secondary | ICD-10-CM

## 2016-07-31 MED ORDER — PREDNISONE 20 MG PO TABS: ORAL_TABLET | ORAL | 0 refills | Status: DC

## 2016-07-31 MED ORDER — AMLODIPINE BESYLATE 5 MG PO TABS: 5.0000 mg | ORAL_TABLET | Freq: Every day | ORAL | 0 refills | Status: DC

## 2016-07-31 NOTE — ED Provider Notes (Signed)
CSN: [id number]     Arrival date & time 07/31/16  1327 History   None    Chief Complaint  Patient presents with  . Hypertension  . Medication Refill   (Consider location/radiation/quality/duration/timing/severity/associated sxs/prior Treatment)  Pt is Spanish speaking but understates English moderately well. Also, accompanied by a friend who helped interpret.   HPI  Marc Gonzalez is a 46 y.o. male presenting to UC with request for refill of his Amlodipine 5mg  that he ran out of about 15 days ago.  Pt also c/o mild intermittent Left arm tingling since being out of his BP medication.  Per medical records, [facility] for Left side facial and arm tingling and numbness. He was sent to the ED where he had a normal workup, no CVA.  Pt denies chest pain or SOB at this time but has had mild headache since being out of his BP medication.  He last saw his family doctor in March 2018.  No other concerns today.     Past Medical History:  Diagnosis Date  . Hypertension   . Paralysis [facility](HCC)    Past Surgical History:  Procedure Laterality Date  . HAND SURGERY     Family History  Problem Relation Age of Onset  . Diabetes Mother   . Hypertension Mother    Social History  Substance Use Topics  . Smoking status: Current Every Day Smoker    Packs/day: 0.50    Types: Cigarettes  . Smokeless tobacco: Never Used  . Alcohol use No    Review of Systems  Constitutional: Negative for chills, fatigue and fever.  Respiratory: Negative for chest tightness and shortness of breath.   Cardiovascular: Negative for chest pain and palpitations.  Gastrointestinal: Negative for diarrhea, nausea and vomiting.  Neurological: Positive for numbness (tingling, Left arm- chronic) and headaches. Negative for dizziness, syncope, weakness and light-headedness.    Allergies  Patient has no known allergies.  Home Medications   Prior to Admission medications   Medication Sig Start Date End Date  Taking? Authorizing Provider  amLODipine (NORVASC) 5 MG tablet Take 1 tablet (5 mg total) by mouth daily. 07/31/16   Lurene ShadowPhelps, [place] Duan O, PA-C  aspirin 81 MG chewable tablet Chew by mouth daily.    [provider]  predniSONE (DELTASONE) 20 MG tablet 3 tabs po day one, then 2 po daily x 4 days 07/31/16   Lurene ShadowPhelps, Samaria Anes O, PA-C   Meds Ordered and Administered this Visit  Medications - No data to display  BP 137/86 (BP Location: Left Arm)   Pulse 75   Temp 98.2 F (36.8 C) (Oral)   Ht 5\' 6"  (1.676 m)   Wt 217 lb (98.4 kg)   SpO2 95%   BMI 35.02 kg/m  No data found.   Physical Exam  Constitutional: He is oriented to person, place, and time. He appears well-developed and well-nourished. No distress.  HENT:  Head: Normocephalic and atraumatic.  Eyes: EOM are normal.  Neck: Normal range of motion.  Cardiovascular: Normal rate and regular rhythm.   Pulmonary/Chest: Effort normal and breath sounds normal. No respiratory distress. He has no wheezes. He has no rales.  Musculoskeletal: Normal range of motion.  Neurological: He is alert and oriented to person, place, and time.  Left side of face- mild droop including eyebrow (chronic per medical records) full ROM upper and lower extremities with 5/5 strength and normal sensation.   Skin: Skin is warm and dry. He is not  diaphoretic.  Psychiatric: He has a normal mood and affect. His behavior is normal.  Nursing note and vitals reviewed.   Urgent Care Course     Procedures (including critical care time)  Labs Review Labs Reviewed - No data to display  Imaging Review No results found.    MDM   1. Essential hypertension   2. Left face and left arm tingling   3. Medication refill    Pt c/o chronic intermittent Left arm and face tingling. Pt was evaluated in hospital for same in 01/2016 w/o significant findings.  No evidence of emergent process taking place at this time. Will refill amlodipine for 30 days Prednisone taper for  5 days to see if this helps with Left arm tingling. Encouraged to f/u with PCP next week for BP check and ongoing medication refills. Pt agreeable.    Lurene Shadow, [place] 07/31/16 [phone]

## 2016-07-31 NOTE — ED Triage Notes (Signed)
Pt is requesting a refill on BP meds Amlodipine 5mg .  Pt works out of town and unable to get in for a refill.

## 2016-11-01 ENCOUNTER — Emergency Department
Admission: EM | Admit: 2016-11-01 | Discharge: 2016-11-01 | Disposition: A | Discharge status: Home / Self Care | Payer: Self-pay | Source: Home / Self Care | Attending: Family Medicine | Admitting: Family Medicine

## 2016-11-01 ENCOUNTER — Encounter: Payer: Self-pay | Admitting: *Deleted

## 2016-11-01 DIAGNOSIS — Z8679 Personal history of other diseases of the circulatory system: Secondary | ICD-10-CM

## 2016-11-01 DIAGNOSIS — M5412 Radiculopathy, cervical region: Secondary | ICD-10-CM

## 2016-11-01 DIAGNOSIS — G510 Bell's palsy: Secondary | ICD-10-CM

## 2016-11-01 DIAGNOSIS — Z760 Encounter for issue of repeat prescription: Secondary | ICD-10-CM

## 2016-11-01 MED ORDER — AMLODIPINE BESYLATE 5 MG PO TABS: 5.0000 mg | ORAL_TABLET | Freq: Every day | ORAL | 0 refills | Status: AC

## 2016-11-01 MED ORDER — AMLODIPINE BESYLATE 5 MG PO TABS: 5.0000 mg | ORAL_TABLET | Freq: Every day | ORAL | 0 refills | Status: DC

## 2016-11-01 MED ORDER — PREDNISONE 20 MG PO TABS: 40.0000 mg | ORAL_TABLET | Freq: Every day | ORAL | 0 refills | Status: DC

## 2016-11-01 NOTE — ED Triage Notes (Signed)
Patient c/o 1 week of intermittent left sided facial numbness/tingling and headache. No current pain. He is A&O x 4. Notable left facial droop and slurred speech. No current HA.

## 2016-11-01 NOTE — ED Provider Notes (Signed)
Marc Gonzalez CARE    CSN: [id number] Arrival date & time: 11/01/16  1045     History   Chief Complaint Chief Complaint  Patient presents with  . Facial Droop  . Numbness    HPI Marc Gonzalez is a 46 y.o. male.  Spanish is primary language, pt declined professional interpreter.   HPI  Marc Gonzalez is a 46 y.o. male presenting to UC with c/o 1 week of intermittent Left sided facial numbness and tingling with mild intermittent generalized headache.  He denies HA or pain at this time.  He has a hx of HTN and has been out of his amlodipine  for about 1 month. He works out of town in various places so he has not had time to f/u with his PCP.   He is also c/o mild intermittent Left side chest soreness and Left arm numbness but denies those symptoms at this time. Per Care Everywhere, pt has chronic Left Bell's Palsy and interment Left side chest pain and Left arm numbness. He has been to hospital, had a normal MRI brain on 01/2016 for same symptoms. He has been f/u with his PCP multiple times for same, who provided an ambulatory referral to neurology, however, pt states due to lack of insurance he has not f/u with a neurologist.   Denies recent head injuries, change in vision, diaphoresis, n/v/d.    Past Medical History:  Diagnosis Date  . Hypertension   . Paralysis [facility])     Patient Active Problem List   Diagnosis Date Noted  . 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 Thyroid nodule 11/25/2014  . 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 Thoracic back pain 10/14/2014  . Lumbago 10/14/2014    Past Surgical History:  Procedure Laterality Date  . HAND SURGERY         Home Medications    Prior to Admission medications   Medication Sig Start Date End Date Taking? Authorizing Provider  amLODipine (NORVASC) 5 MG tablet Take 1 tablet (5 mg total) by mouth daily. 11/01/16   Lurene Shadow, PA-C  aspirin 81 MG chewable tablet Chew by mouth daily.     [provider]  predniSONE (DELTASONE) 20 MG tablet Take 2 tablets (40 mg total) by mouth daily with breakfast. For 4 days 11/01/16   Lurene Shadow, PA-C    Family History Family History  Problem Relation Age of Onset  . Diabetes Mother   . Hypertension Mother     Social History Social History  Substance Use Topics  . Smoking status: Current Every Day Smoker    Packs/day: 0.50    Types: Cigarettes  . Smokeless tobacco: Never Used  . Alcohol use No     Allergies   Patient has no known allergies.   Review of Systems Review of Systems  Constitutional: Negative for chills, diaphoresis and fever.  Respiratory: Negative for chest tightness, shortness of breath and wheezing.   Cardiovascular: Positive for chest pain (Left side, intermittent, chronic). Negative for palpitations and leg swelling.  Gastrointestinal: Negative for abdominal pain, diarrhea, nausea and vomiting.  Musculoskeletal: Negative for arthralgias, neck pain and neck stiffness.  Neurological: Positive for facial asymmetry (Left side droop, chronic), weakness (Left side of face, chronic) and numbness. Negative for dizziness, tremors, seizures, syncope, speech difficulty, light-headedness and headaches.     Physical Exam Triage Vital Signs ED Triage Vitals [11/01/16 1059]  Enc Vitals Group     BP (!) 151/94  Pulse Rate 72     Resp 14     Temp 98.2 F (36.8 C)     Temp Source Oral     SpO2 97 %     Weight      Height      Head Circumference      Peak Flow      Pain Score 0     Pain Loc      Pain Edu?      Excl. in GC?    No data found.   Updated Vital Signs BP 129/82 (BP Location: Left Arm)   Pulse 72   Temp 98.2 F (36.8 C) (Oral)   Resp 14   SpO2 97%   Visual Acuity Right Eye Distance:   Left Eye Distance:   Bilateral Distance:    Right Eye Near:   Left Eye Near:    Bilateral Near:     Physical Exam  Constitutional: He is oriented to person, place, and time. He  appears well-developed and well-nourished. No distress.  HENT:  Head: Normocephalic and atraumatic.  Right Ear: Tympanic membrane normal.  Left Ear: Tympanic membrane normal.  Nose: Nose normal.  Mouth/Throat: Uvula is midline, oropharynx is clear and moist and mucous membranes are normal.  Eyes: Pupils are equal, round, and reactive to light. Conjunctivae and EOM are normal.  Neck: Normal range of motion. Neck supple.  No midline bone tenderness, no crepitus or step-offs.   Cardiovascular: Normal rate and regular rhythm.   Pulmonary/Chest: Breath sounds normal. No respiratory distress. He has no wheezes. He has no rales. He exhibits no tenderness.  Musculoskeletal: Normal range of motion.  Neurological: He is alert and oriented to person, place, and time.  Mild Left sided facial droop including Left eyebrow. Able to stick tongue out straight. Slight decreased sensation on Left side of face, otherwise, normal neuro exam. 5/5 grip strength bilaterally. Full ROM upper and lower extremities. Normal gait.   Skin: Skin is warm and dry. He is not diaphoretic.  Psychiatric: He has a normal mood and affect. His behavior is normal.  Nursing note and vitals reviewed.    UC Treatments / Results  Labs (all labs ordered are listed, but only abnormal results are displayed) Labs Reviewed - No data to display  EKG  EKG Interpretation None       Radiology No results found.  Procedures Procedures (including critical care time)  Medications Ordered in UC Medications - No data to display   Initial Impression / Assessment and Plan / UC Course  I have reviewed the triage vital signs and the nursing notes.  Pertinent labs & imaging results that were available during my care of the patient were reviewed by me and considered in my medical decision making (see chart for details).     Pt c/o Left sided facial numbness intermittently for about 1 week. Hx of HTN, has been out of his amlodipine   for 1 month. Denies HA.  PMH reviewed including MRI of brain on 01/22/16 when he initially presented to [facility] with these symptoms.  Per Care Everywhere, pt has been given referral to neurology but pt states he has not been able to see specialist due to lack of insurance.  Left side facial droop is chronic, neuro exam otherwise normal. Will refill 2 weeks of his BP medication and start on 4 days of prednisone, pt states he has had relief with prednisone in the past. Encouraged f/u with PCP for medication refills  and help with f/u with specialists. No evidence of emergent process taking place at this time.  Discussed symptoms that warrant emergent care in the ED.   Final Clinical Impressions(s) / UC Diagnoses   Final diagnoses:  Left-sided Bell's palsy  Medication refill  History of hypertension  Left cervical radiculopathy    New Prescriptions Discharge Medication List as of 11/01/2016 11:15 AM    START taking these medications   Details  predniSONE (DELTASONE) 20 MG tablet Take 2 tablets (40 mg total) by mouth daily with breakfast. For 4 days, Starting Mon 11/01/2016, Normal         Controlled Substance Prescriptions [place] Controlled Substance Registry consulted? Not Applicable   Rolla Plate 11/01/16 1448

## 2016-11-02 ENCOUNTER — Telehealth: Payer: Self-pay | Admitting: *Deleted

## 2016-11-02 NOTE — Telephone Encounter (Signed)
Call back: called to check pt's status. LM to call back if he has any questions or concerns. 

## 2018-06-10 ENCOUNTER — Encounter: Payer: Self-pay | Admitting: Emergency Medicine

## 2018-06-10 ENCOUNTER — Emergency Department (INDEPENDENT_AMBULATORY_CARE_PROVIDER_SITE_OTHER): Payer: Self-pay

## 2018-06-10 ENCOUNTER — Other Ambulatory Visit: Payer: Self-pay

## 2018-06-10 ENCOUNTER — Emergency Department
Admission: EM | Admit: 2018-06-10 | Discharge: 2018-06-10 | Disposition: A | Discharge status: Home / Self Care | Payer: Self-pay | Source: Home / Self Care | Attending: Family Medicine | Admitting: Family Medicine

## 2018-06-10 DIAGNOSIS — R208 Other disturbances of skin sensation: Secondary | ICD-10-CM

## 2018-06-10 DIAGNOSIS — M5416 Radiculopathy, lumbar region: Secondary | ICD-10-CM

## 2018-06-10 HISTORY — DX: Dorsalgia, unspecified: M54.9

## 2018-06-10 HISTORY — DX: Localized swelling, mass and lump, head: R22.0

## 2018-06-10 HISTORY — DX: Disorder of thyroid, unspecified: E07.9

## 2018-06-10 HISTORY — DX: Bell's palsy: G51.0

## 2018-06-10 HISTORY — DX: Other diseases of tongue: K14.8

## 2018-06-10 LAB — POCT CBC W AUTO DIFF (K'VILLE URGENT CARE)

## 2018-06-10 MED ORDER — PREDNISONE 20 MG PO TABS: ORAL_TABLET | ORAL | 0 refills | Status: DC

## 2018-06-10 MED ORDER — HYDROCODONE-ACETAMINOPHEN 5-325 MG PO TABS: ORAL_TABLET | ORAL | 0 refills | Status: DC

## 2018-06-10 NOTE — ED Provider Notes (Signed)
[facility]    CSN: [id number] Arrival date & time: 06/10/18  1241     History   Chief Complaint Chief Complaint  Patient presents with  . Leg Pain  . Chills    HPI Marc Gonzalez is a 48 y.o. male.   Patient is Spanish speaking and [employer] interpreter translated interview. Patient reports that he washed a house five days ago using a Chlorox bleach solution which soaked his shoes.  He subsequently developed a sensation of tingling and burning in both feet, worse on the left.  He denies rash.  The tingling sensation in his left leg has become worse and now radiates up to his left lower back, causing a burning sensation there.   He denies bowel or bladder dysfunction, and no saddle numbness.  He also complains of intermittent stabbing sensations in his head, without associated neurologic symptoms. Review of chart records reveals that patient had presented to the [facility] ED on 01/22/16 with a 3 week history of tingling and numbness in his left arm.  A MRI of brain at that time showed no acute abnormalities.  There was, however, "borderline low-lying cerebellar tonsils, mild crowding of the foramen magnum, and anistropic artifact in the corticospinal tract."  He was treated for a possible cervical radiculopathy with prednisone and referred to a neurologist for follow-up. Further record review reveals an MRI (w/o contrast) lumbar spine 11/23/14 that revealed a tiny disc bulge at L4-5, and broad-based tiny disc bulging at L5-S1  The history is provided by the patient. The history is limited by a language barrier. A language interpreter was used.    Past Medical History:  Diagnosis Date  . Back pain   . Bell's palsy   . Hypertension   . Paralysis (HCC)   . Thyroid disease    nodule  . Tongue mass     Patient Active Problem List   Diagnosis Date Noted  . Bell's palsy 02/02/2016  . Essential hypertension 02/02/2016  . Pure hypertriglyceridemia  02/02/2016  . Chest pain 01/19/2016  . 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 Thyroid nodule 11/25/2014  . 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 Thoracic back pain 10/14/2014  . Lumbago 10/14/2014  . Cervical strain 08/09/2014  . Spondylosis of cervical region without myelopathy or radiculopathy 08/09/2014    Past Surgical History:  Procedure Laterality Date  . HAND SURGERY    . WRIST SURGERY         Home Medications    Prior to Admission medications   Medication Sig Start Date End Date Taking? Authorizing Provider  amLODipine (NORVASC) 5 MG tablet Take 1 tablet (5 mg total) by mouth daily. 11/01/16   Lurene Shadow, PA-C  aspirin 81 MG chewable tablet Chew by mouth daily.    [provider]  HYDROcodone-acetaminophen (NORCO/VICODIN) 5-325 MG tablet Take one by mouth at bedtime as needed for pain.  May repeat in 4 to 6 hours prn 06/10/18   Lattie Haw, MD  predniSONE (DELTASONE) 20 MG tablet Take one tab by mouth twice daily for 4 days, then one daily. Take with food. 06/10/18   Lattie Haw, MD    Family History Family History  Problem Relation Age of Onset  . Diabetes Mother   . Hypertension Mother     Social History Social History   Tobacco Use  . Smoking status: Current Every Day Smoker    Packs/day: 0.50    Types:  Cigarettes  . Smokeless tobacco: Never Used  Substance Use Topics  . Alcohol use: No  . Drug use: No     Allergies   Patient has no known allergies.   Review of Systems Review of Systems  Constitutional: Positive for chills.  HENT: Negative.   Eyes: Negative.   Respiratory: Negative.   Cardiovascular: Negative.   Gastrointestinal: Negative.   Genitourinary: Negative.   Musculoskeletal: Positive for back pain.  Skin: Negative.   Neurological: Positive for headaches. Negative for dizziness and weakness.       Paresthesias legs     Physical Exam Triage Vital Signs ED Triage Vitals  Enc Vitals  Group     BP 06/10/18 1333 135/75     Pulse Rate 06/10/18 1333 99     Resp 06/10/18 1333 18     Temp 06/10/18 1333 98.5 F (36.9 C)     Temp Source 06/10/18 1333 Oral     SpO2 06/10/18 1333 97 %     Weight 06/10/18 1335 210 lb (95.3 kg)     Height 06/10/18 1335 5\' 5"  (1.651 m)     Head Circumference --      Peak Flow --      Pain Score 06/10/18 1334 3     Pain Loc --      Pain Edu? --      Excl. in GC? --    No data found.  Updated Vital Signs BP 135/75 (BP Location: Right Arm)   Pulse 99   Temp 98.5 F (36.9 C) (Oral)   Resp 18   Ht 5\' 5"  (1.651 m)   Wt 95.3 kg   SpO2 97%   BMI 34.95 kg/m   Visual Acuity Right Eye Distance:   Left Eye Distance:   Bilateral Distance:    Right Eye Near:   Left Eye Near:    Bilateral Near:     Physical Exam Nursing notes and Vital Signs reviewed. Appearance:  Patient appears stated age, and in no acute distress.    Eyes:  Pupils are equal, round, and reactive to light and accomodation.  Extraocular movement is intact.  Conjunctivae are not inflamed.  Fundi benign. Pharynx:  Normal; moist mucous membranes  Neck:  Supple.  No adenopathy Lungs:  Clear to auscultation.  Breath sounds are equal.  Moving air well. Heart:  Regular rate and rhythm without murmurs, rubs, or gallops.  Abdomen:  Nontender without masses or hepatosplenomegaly.  Bowel sounds are present.  No CVA or flank tenderness.  Extremities:  No edema.  Skin:  No rash present.    Back:  Range of motion relatively well preserved.  No tenderness to palpation.  Straight leg raising test is positive on the left at about 45 degrees.  Sitting knee extension test is positive on the left at about 45 degrees from horizontal.  Strength and sensation in the lower extremities is normal.  Patellar and achilles reflexes are normal. Neurologic:  Cranial nerves 2 through 12 are normal.  Patellar, achilles, and elbow reflexes are normal.  Cerebellar function is intact (finger-to-nose and  rapid alternating hand movement).  Gait and station are normal.  Grip strength symmetric bilaterally.   UC Treatments / Results  Labs (all labs ordered are listed, but only abnormal results are displayed) Labs Reviewed  POCT CBC W AUTO DIFF ([facility]):  WBC 5.9; LY 28.9; MO 5.7; GR 65.4; Hgb 14.7; Platelets 224     EKG None  Radiology Dg Lumbar  Spine 2-3 Views  Result Date: 06/10/2018 CLINICAL DATA:  Burning sensation in lower back and bilateral legs for 1 week EXAM: LUMBAR SPINE - 2-3 VIEW COMPARISON:  11/23/2014 FINDINGS: Anatomic alignment. No vertebral compression deformity. There is moderate narrowing of the L4-5 and L5-S1 discs. IMPRESSION: No acute bony pathology.  Degenerative change. Electronically Signed   By: Jolaine ClickArthur  Hoss M.D.   On: 06/10/2018 15:58    Procedures Procedures (including critical care time)  Medications Ordered in UC Medications - No data to display  Initial Impression / Assessment and Plan / UC Course  I have reviewed the triage vital signs and the nursing notes.  Pertinent labs & imaging results that were available during my care of the patient were reviewed by me and considered in my medical decision making (see chart for details).    Begin prednisone burst/taper. Rx for Lortab at bedtime (#10, no refill). Controlled Substance Prescriptions I have consulted the [place] Controlled Substances Registry for this patient, and feel the risk/benefit ratio today is favorable for proceeding with this prescription for a controlled substance.   Followup with Dr. Rodney Langtonhomas Thekkekandam or Dr. Clementeen GrahamEvan Corey ([facility]) in about 6 days for further evaluation.   Final Clinical Impressions(s) / UC Diagnoses   Final diagnoses:  Lumbar back pain with radiculopathy affecting left lower extremity     Discharge Instructions     Apply ice pack to lower back for 20 to 30 minutes, 3 to 4 times daily  Continue until pain decreases.     ED  Prescriptions    Medication Sig Dispense Auth. Provider   predniSONE (DELTASONE) 20 MG tablet Take one tab by mouth twice daily for 4 days, then one daily. Take with food. 12 tablet Lattie HawBeese, Marc A, MD   HYDROcodone-acetaminophen (NORCO/VICODIN) 5-325 MG tablet Take one by mouth at bedtime as needed for pain.  May repeat in 4 to 6 hours prn 10 tablet Cathren HarshBeese, Tera MaterStephen A, MD        Lattie HawBeese, Marc A, MD 06/11/18 [phone]

## 2018-06-10 NOTE — Discharge Instructions (Addendum)
Apply ice pack to lower back for 20 to 30 minutes, 3 to 4 times daily  Continue until pain decreases.  °

## 2018-06-10 NOTE — ED Triage Notes (Signed)
[employer] Interpreter [phone] used for patient interview. He reports washing a house 5 days ago with chlorox and since then has had intermittent chills, burning from bottom of feet up to his lower back; no rash; has showered with plain soap; has laundered clothing. He has occasional headaches and extra phlegm and he takes benadryl and tylenol; denies cough. He works in [place] and travels back and forth.

## 2018-06-10 NOTE — ED Triage Notes (Signed)
Patient would like rx for hypertension; he has not been taking Norvasc due to cost and lack of insurance; he now has insurance.

## 2018-06-12 ENCOUNTER — Telehealth: Payer: Self-pay

## 2018-06-12 NOTE — Telephone Encounter (Signed)
Spoke with patient, still having pain.  Explained per DC instructions to set up appointment with T or Denyse Amass.

## 2023-05-14 ENCOUNTER — Ambulatory Visit
Admission: EM | Admit: 2023-05-14 | Discharge: 2023-05-14 | Disposition: A | Discharge status: Home / Self Care | Attending: Family Medicine | Admitting: Family Medicine

## 2023-05-14 ENCOUNTER — Encounter: Payer: Self-pay | Admitting: Emergency Medicine

## 2023-05-14 DIAGNOSIS — L03039 Cellulitis of unspecified toe: Secondary | ICD-10-CM

## 2023-05-14 MED ORDER — CEFADROXIL 500 MG PO CAPS: 500.0000 mg | ORAL_CAPSULE | Freq: Two times a day (BID) | ORAL | 0 refills | Status: AC

## 2023-05-14 MED ORDER — HYDROCODONE-ACETAMINOPHEN 5-325 MG PO TABS: 1.0000 | ORAL_TABLET | Freq: Four times a day (QID) | ORAL | 0 refills | Status: AC | PRN

## 2023-05-14 NOTE — ED Triage Notes (Signed)
 Patient c/o right big toe pain x 2 weeks, no injury.  The area is red and painful.  There is a blister on the area.  Patient has not applied anything to the area.

## 2023-05-14 NOTE — ED Provider Notes (Signed)
 [name]    CSN: [id number] Arrival date & time: 05/14/23  1118      History   Chief Complaint Chief Complaint  Patient presents with   Toe Pain    HPI Marc Gonzalez is a 53 y.o. male.   Patient has been [place] for 30 years and speaks [place] well.  He declines an interpreter.  He has had toe pain for the last week.  It is getting worse.  Swollen and he has noticed that it looks like it is infected.  He is not diabetic.  He has not had this problem before.  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 Authorizing Provider  amLODipine (NORVASC) 5 MG tablet Take 1 tablet (5 mg total) by mouth daily. 11/01/16  Yes Phelps, Erin O, PA-C  cefadroxil (DURICEF) 500 MG capsule Take 1 capsule (500 mg total) by mouth 2 (two) times daily. 05/14/23  Yes Eustace Moore, MD  HYDROcodone-acetaminophen (NORCO/VICODIN) 5-325 MG tablet Take 1-2 tablets by mouth every 6 (six) hours as needed. 05/14/23  Yes Eustace Moore, MD    Family History Family History  Problem Relation Age of Onset   Diabetes Mother    Hypertension Mother     Social History Social History    Tobacco Use   Smoking status: Former    Current packs/day: 0.50    Types: Cigarettes   Smokeless tobacco: Never  Substance Use Topics   Alcohol use: No   Drug use: No     Allergies   Patient has no known allergies.   Review of Systems Review of Systems See HPI  Physical Exam Triage Vital Signs ED Triage Vitals  Encounter Vitals Group     BP 05/14/23 1128 (!) 146/84     Systolic BP Percentile --      Diastolic BP Percentile --      Pulse Rate 05/14/23 1128 78     Resp 05/14/23 1128 18     Temp 05/14/23 1128 99.2 F (37.3 C)     Temp Source 05/14/23 1128 Oral     SpO2 05/14/23 1128 96 %     Weight --      Height --      Head Circumference --      Peak Flow --      Pain Score 05/14/23 1130 8     Pain Loc --      Pain Education --      Exclude from Growth Chart --    No data found.  Updated Vital Signs BP (!) 146/84 (BP Location: Right Arm)   Pulse 78   Temp 99.2 F (37.3 C) (Oral)   Resp 18   SpO2 96%      Physical Exam Constitutional:      General: He is not in acute distress.    Appearance: He is well-developed.  HENT:     Head: Normocephalic and atraumatic.  Eyes:     Conjunctiva/sclera: Conjunctivae normal.     Pupils: Pupils are equal, round, and reactive to light.  Cardiovascular:     Rate and Rhythm: Normal rate.  Pulmonary:     Effort: Pulmonary effort is normal. No respiratory distress.  Musculoskeletal:        General: Normal range of motion.     Cervical back: Normal range of motion.  Skin:    General: Skin is warm and dry.     Findings: Erythema present.     Comments: Great toe on right foot has a paronychia at the great toe nail, with yellow purulence visible under the skin.  This is easily opened with an 18-gauge needle  Neurological:     Mental Status: He is alert.      UC Treatments / Results  Labs (all labs ordered are listed, but only abnormal results are displayed) Labs Reviewed - No data to  display  EKG   Radiology No results found.  Procedures Procedures (including critical care time)  Medications Ordered in UC Medications - No data to display  Initial Impression / Assessment and Plan / UC Course  I have reviewed the triage vital signs and the nursing notes.  Pertinent labs & imaging results that were available during my care of the patient were reviewed by me and considered in my medical decision making (see chart for details).     Final Clinical Impressions(s) / UC Diagnoses   Final diagnoses:  Paronychia of great toe     Discharge Instructions      Soak in warm water for 20 minutes Take the antibiotic as directed Take 2 doses today Take pain medicine if needed So not drive on pain medicine   ED Prescriptions     Medication Sig Dispense Auth. Provider   cefadroxil (DURICEF) 500 MG capsule Take 1 capsule (500 mg total) by mouth 2 (two) times daily. 14 capsule Eustace Moore, MD   HYDROcodone-acetaminophen (NORCO/VICODIN) 5-325 MG tablet Take 1-2 tablets by mouth every 6 (six) hours as needed. 10 tablet Eustace Moore, MD      I have reviewed the PDMP during this encounter.   Eustace Moore, MD 05/14/23 [phone]

## 2023-05-14 NOTE — Discharge Instructions (Signed)
 Soak in warm water for 20 minutes Take the antibiotic as directed Take 2 doses today Take pain medicine if needed So not drive on pain medicine
